# Patient Record
Sex: Male | Born: 1940 | Race: White | Hispanic: No | Marital: Married | Smoking: Former smoker
Health system: Southern US, Community
[De-identification: ages and names within clinical notes are randomized; demographics above are authoritative.]

## PROBLEM LIST (undated history)

## (undated) DIAGNOSIS — K219 Gastro-esophageal reflux disease without esophagitis: Secondary | ICD-10-CM

## (undated) DIAGNOSIS — I10 Essential (primary) hypertension: Secondary | ICD-10-CM

## (undated) DIAGNOSIS — F419 Anxiety disorder, unspecified: Secondary | ICD-10-CM

## (undated) DIAGNOSIS — I2510 Atherosclerotic heart disease of native coronary artery without angina pectoris: Secondary | ICD-10-CM

## (undated) DIAGNOSIS — M79661 Pain in right lower leg: Secondary | ICD-10-CM

## (undated) DIAGNOSIS — M79662 Pain in left lower leg: Secondary | ICD-10-CM

## (undated) DIAGNOSIS — R0602 Shortness of breath: Secondary | ICD-10-CM

## (undated) DIAGNOSIS — E785 Hyperlipidemia, unspecified: Secondary | ICD-10-CM

## (undated) DIAGNOSIS — M1990 Unspecified osteoarthritis, unspecified site: Secondary | ICD-10-CM

## (undated) HISTORY — DX: Shortness of breath: R06.02

## (undated) HISTORY — DX: Hyperlipidemia, unspecified: E78.5

## (undated) HISTORY — DX: Atherosclerotic heart disease of native coronary artery without angina pectoris: I25.10

## (undated) HISTORY — DX: Essential (primary) hypertension: I10

## (undated) HISTORY — DX: Pain in left lower leg: M79.662

## (undated) HISTORY — DX: Pain in right lower leg: M79.661

## (undated) HISTORY — DX: Gastro-esophageal reflux disease without esophagitis: K21.9

## (undated) HISTORY — DX: Anxiety disorder, unspecified: F41.9

## (undated) HISTORY — DX: Unspecified osteoarthritis, unspecified site: M19.90

## (undated) HISTORY — PX: CORONARY ARTERY BYPASS GRAFT: SHX141

---

## 2000-05-27 ENCOUNTER — Encounter: Payer: Self-pay | Admitting: *Deleted

## 2000-05-27 ENCOUNTER — Inpatient Hospital Stay (HOSPITAL_COMMUNITY): Admission: EM | Admit: 2000-05-27 | Discharge: 2000-06-04 | Payer: Self-pay

## 2000-05-29 ENCOUNTER — Encounter: Payer: Self-pay | Admitting: Thoracic Surgery (Cardiothoracic Vascular Surgery)

## 2000-05-31 ENCOUNTER — Encounter: Payer: Self-pay | Admitting: Thoracic Surgery (Cardiothoracic Vascular Surgery)

## 2000-06-01 ENCOUNTER — Encounter: Payer: Self-pay | Admitting: Thoracic Surgery (Cardiothoracic Vascular Surgery)

## 2000-06-02 ENCOUNTER — Encounter: Payer: Self-pay | Admitting: Thoracic Surgery (Cardiothoracic Vascular Surgery)

## 2000-06-03 ENCOUNTER — Encounter: Payer: Self-pay | Admitting: Cardiothoracic Surgery

## 2000-07-03 ENCOUNTER — Encounter
Admission: RE | Admit: 2000-07-03 | Discharge: 2000-07-03 | Payer: Self-pay | Admitting: Thoracic Surgery (Cardiothoracic Vascular Surgery)

## 2000-07-03 ENCOUNTER — Encounter: Payer: Self-pay | Admitting: Thoracic Surgery (Cardiothoracic Vascular Surgery)

## 2000-12-31 ENCOUNTER — Encounter: Payer: Self-pay | Admitting: Neurosurgery

## 2000-12-31 ENCOUNTER — Encounter: Admission: RE | Admit: 2000-12-31 | Discharge: 2000-12-31 | Payer: Self-pay | Admitting: Neurosurgery

## 2001-01-14 ENCOUNTER — Encounter: Payer: Self-pay | Admitting: Neurosurgery

## 2001-01-14 ENCOUNTER — Encounter: Admission: RE | Admit: 2001-01-14 | Discharge: 2001-01-14 | Payer: Self-pay | Admitting: Neurosurgery

## 2001-01-28 ENCOUNTER — Encounter: Admission: RE | Admit: 2001-01-28 | Discharge: 2001-01-28 | Payer: Self-pay | Admitting: Neurosurgery

## 2001-01-28 ENCOUNTER — Encounter: Payer: Self-pay | Admitting: Neurosurgery

## 2001-02-23 ENCOUNTER — Emergency Department (HOSPITAL_COMMUNITY): Admission: EM | Admit: 2001-02-23 | Discharge: 2001-02-24 | Payer: Self-pay | Admitting: Emergency Medicine

## 2001-02-23 ENCOUNTER — Encounter: Payer: Self-pay | Admitting: Emergency Medicine

## 2001-02-25 ENCOUNTER — Encounter: Admission: RE | Admit: 2001-02-25 | Discharge: 2001-02-25 | Payer: Self-pay | Admitting: Neurosurgery

## 2001-02-25 ENCOUNTER — Encounter: Payer: Self-pay | Admitting: Neurosurgery

## 2001-03-04 ENCOUNTER — Encounter: Payer: Self-pay | Admitting: Neurosurgery

## 2001-03-06 ENCOUNTER — Encounter: Payer: Self-pay | Admitting: Neurosurgery

## 2001-03-06 ENCOUNTER — Inpatient Hospital Stay (HOSPITAL_COMMUNITY): Admission: RE | Admit: 2001-03-06 | Discharge: 2001-03-07 | Payer: Self-pay | Admitting: Neurosurgery

## 2001-03-18 ENCOUNTER — Inpatient Hospital Stay (HOSPITAL_COMMUNITY): Admission: EM | Admit: 2001-03-18 | Discharge: 2001-03-21 | Payer: Self-pay | Admitting: Emergency Medicine

## 2001-03-18 ENCOUNTER — Encounter: Payer: Self-pay | Admitting: Emergency Medicine

## 2001-04-04 ENCOUNTER — Inpatient Hospital Stay (HOSPITAL_COMMUNITY): Admission: EM | Admit: 2001-04-04 | Discharge: 2001-04-08 | Payer: Self-pay | Admitting: Emergency Medicine

## 2001-04-04 ENCOUNTER — Encounter: Payer: Self-pay | Admitting: Emergency Medicine

## 2001-05-09 ENCOUNTER — Encounter: Payer: Self-pay | Admitting: Cardiovascular Disease

## 2001-05-09 ENCOUNTER — Ambulatory Visit (HOSPITAL_COMMUNITY): Admission: RE | Admit: 2001-05-09 | Discharge: 2001-05-09 | Payer: Self-pay | Admitting: Cardiovascular Disease

## 2001-06-20 ENCOUNTER — Encounter (INDEPENDENT_AMBULATORY_CARE_PROVIDER_SITE_OTHER): Payer: Self-pay | Admitting: *Deleted

## 2001-06-20 ENCOUNTER — Observation Stay (HOSPITAL_COMMUNITY): Admission: RE | Admit: 2001-06-20 | Discharge: 2001-06-21 | Payer: Self-pay | Admitting: General Surgery

## 2001-07-16 ENCOUNTER — Encounter: Payer: Self-pay | Admitting: Emergency Medicine

## 2001-07-16 ENCOUNTER — Emergency Department (HOSPITAL_COMMUNITY): Admission: EM | Admit: 2001-07-16 | Discharge: 2001-07-17 | Payer: Self-pay | Admitting: Emergency Medicine

## 2002-06-15 ENCOUNTER — Encounter: Payer: Self-pay | Admitting: Emergency Medicine

## 2002-06-15 ENCOUNTER — Inpatient Hospital Stay (HOSPITAL_COMMUNITY): Admission: EM | Admit: 2002-06-15 | Discharge: 2002-06-17 | Payer: Self-pay | Admitting: Emergency Medicine

## 2004-05-24 ENCOUNTER — Observation Stay (HOSPITAL_COMMUNITY): Admission: EM | Admit: 2004-05-24 | Discharge: 2004-05-24 | Payer: Self-pay | Admitting: Emergency Medicine

## 2004-10-25 ENCOUNTER — Ambulatory Visit: Payer: Self-pay | Admitting: Specialist

## 2005-05-23 ENCOUNTER — Emergency Department (HOSPITAL_COMMUNITY): Admission: EM | Admit: 2005-05-23 | Discharge: 2005-05-23 | Payer: Self-pay | Admitting: Emergency Medicine

## 2005-12-11 HISTORY — PX: CARDIOVASCULAR STRESS TEST: SHX262

## 2005-12-25 ENCOUNTER — Observation Stay (HOSPITAL_COMMUNITY): Admission: EM | Admit: 2005-12-25 | Discharge: 2005-12-26 | Payer: Self-pay | Admitting: Emergency Medicine

## 2006-07-22 ENCOUNTER — Other Ambulatory Visit: Payer: Self-pay

## 2006-07-22 ENCOUNTER — Inpatient Hospital Stay: Payer: Self-pay | Admitting: Surgery

## 2006-08-16 ENCOUNTER — Ambulatory Visit: Admission: RE | Admit: 2006-08-16 | Discharge: 2006-11-13 | Payer: Self-pay | Admitting: Radiation Oncology

## 2006-10-03 ENCOUNTER — Inpatient Hospital Stay (HOSPITAL_COMMUNITY): Admission: RE | Admit: 2006-10-03 | Discharge: 2006-10-05 | Payer: Self-pay | Admitting: Urology

## 2006-10-03 ENCOUNTER — Encounter (INDEPENDENT_AMBULATORY_CARE_PROVIDER_SITE_OTHER): Payer: Self-pay | Admitting: *Deleted

## 2007-04-17 ENCOUNTER — Ambulatory Visit: Payer: Self-pay | Admitting: Unknown Physician Specialty

## 2007-05-04 ENCOUNTER — Inpatient Hospital Stay (HOSPITAL_COMMUNITY): Admission: EM | Admit: 2007-05-04 | Discharge: 2007-05-06 | Payer: Self-pay | Admitting: Emergency Medicine

## 2007-10-21 ENCOUNTER — Other Ambulatory Visit: Payer: Self-pay

## 2007-10-21 ENCOUNTER — Emergency Department: Payer: Self-pay | Admitting: Internal Medicine

## 2008-06-02 ENCOUNTER — Ambulatory Visit: Payer: Self-pay | Admitting: Neurology

## 2009-05-05 ENCOUNTER — Inpatient Hospital Stay (HOSPITAL_COMMUNITY): Admission: EM | Admit: 2009-05-05 | Discharge: 2009-05-06 | Payer: Self-pay | Admitting: Emergency Medicine

## 2009-05-06 HISTORY — PX: CARDIAC CATHETERIZATION: SHX172

## 2010-05-16 ENCOUNTER — Ambulatory Visit: Payer: Self-pay | Admitting: Cardiovascular Disease

## 2010-10-01 ENCOUNTER — Ambulatory Visit: Payer: PRIVATE HEALTH INSURANCE | Admitting: Family Medicine

## 2010-11-16 ENCOUNTER — Ambulatory Visit (INDEPENDENT_AMBULATORY_CARE_PROVIDER_SITE_OTHER): Payer: Medicare Other | Admitting: Cardiovascular Disease

## 2010-11-16 DIAGNOSIS — E7800 Pure hypercholesterolemia, unspecified: Secondary | ICD-10-CM

## 2010-11-16 DIAGNOSIS — I2510 Atherosclerotic heart disease of native coronary artery without angina pectoris: Secondary | ICD-10-CM

## 2010-11-16 DIAGNOSIS — Z9861 Coronary angioplasty status: Secondary | ICD-10-CM

## 2010-11-16 DIAGNOSIS — M79609 Pain in unspecified limb: Secondary | ICD-10-CM

## 2010-12-01 ENCOUNTER — Emergency Department (HOSPITAL_COMMUNITY)
Admission: EM | Admit: 2010-12-01 | Discharge: 2010-12-01 | Disposition: A | Discharge status: Home / Self Care | Payer: Medicare Other | Attending: Emergency Medicine | Admitting: Emergency Medicine

## 2010-12-01 DIAGNOSIS — I252 Old myocardial infarction: Secondary | ICD-10-CM | POA: Insufficient documentation

## 2010-12-01 DIAGNOSIS — I10 Essential (primary) hypertension: Secondary | ICD-10-CM | POA: Insufficient documentation

## 2010-12-01 DIAGNOSIS — Z79899 Other long term (current) drug therapy: Secondary | ICD-10-CM | POA: Insufficient documentation

## 2010-12-01 DIAGNOSIS — E785 Hyperlipidemia, unspecified: Secondary | ICD-10-CM | POA: Insufficient documentation

## 2010-12-01 DIAGNOSIS — I2510 Atherosclerotic heart disease of native coronary artery without angina pectoris: Secondary | ICD-10-CM | POA: Insufficient documentation

## 2010-12-01 DIAGNOSIS — I959 Hypotension, unspecified: Secondary | ICD-10-CM | POA: Insufficient documentation

## 2010-12-01 LAB — DIFFERENTIAL
Basophils Absolute: 0 10*3/uL (ref 0.0–0.1)
Basophils Relative: 0 % (ref 0–1)
Lymphocytes Relative: 42 % (ref 12–46)
Lymphs Abs: 2.5 10*3/uL (ref 0.7–4.0)
Monocytes Absolute: 0.5 10*3/uL (ref 0.1–1.0)
Monocytes Relative: 8 % (ref 3–12)
Neutro Abs: 2.9 10*3/uL (ref 1.7–7.7)

## 2010-12-01 LAB — CBC
HCT: 44 % (ref 39.0–52.0)
MCH: 31.8 pg (ref 26.0–34.0)
MCV: 87.5 fL (ref 78.0–100.0)
RBC: 5.03 MIL/uL (ref 4.22–5.81)
RDW: 13.6 % (ref 11.5–15.5)
WBC: 5.9 10*3/uL (ref 4.0–10.5)

## 2010-12-01 LAB — BASIC METABOLIC PANEL
BUN: 18 mg/dL (ref 6–23)
Chloride: 111 mEq/L (ref 96–112)
GFR calc Af Amer: >60 mL/min (ref >60)

## 2010-12-23 LAB — DIFFERENTIAL
Basophils Absolute: 0 10*3/uL (ref 0.0–0.1)
Basophils Relative: 0 % (ref 0–1)
Eosinophils Relative: 1 % (ref 0–5)
Monocytes Absolute: 0.5 10*3/uL (ref 0.1–1.0)
Neutro Abs: 4.1 10*3/uL (ref 1.7–7.7)

## 2010-12-23 LAB — CARDIAC PANEL(CRET KIN+CKTOT+MB+TROPI)
CK, MB: 1 ng/mL (ref 0.3–4.0)
CK, MB: 1.1 ng/mL (ref 0.3–4.0)
Relative Index: INVALID (ref 0.0–2.5)
Total CK: 99 U/L (ref 7–232)
Troponin I: 0.03 ng/mL (ref 0.00–0.06)

## 2010-12-23 LAB — CBC
HCT: 45 % (ref 39.0–52.0)
Hemoglobin: 15.1 g/dL (ref 13.0–17.0)
Hemoglobin: 15.5 g/dL (ref 13.0–17.0)
MCHC: 34.7 g/dL (ref 30.0–36.0)
MCV: 92.8 fL (ref 78.0–100.0)
Platelets: 147 10*3/uL — ABNORMAL LOW (ref 150–400)
RBC: 4.67 MIL/uL (ref 4.22–5.81)
RBC: 4.82 MIL/uL (ref 4.22–5.81)
WBC: 6.8 10*3/uL (ref 4.0–10.5)

## 2010-12-23 LAB — HEPARIN LEVEL (UNFRACTIONATED): Heparin Unfractionated: 0.74 IU/mL — ABNORMAL HIGH (ref 0.30–0.70)

## 2010-12-23 LAB — COMPREHENSIVE METABOLIC PANEL
Albumin: 4.1 g/dL (ref 3.5–5.2)
Potassium: 3.8 mEq/L (ref 3.5–5.1)
Total Bilirubin: 0.9 mg/dL (ref 0.3–1.2)

## 2010-12-23 LAB — TROPONIN I: Troponin I: 0.02 ng/mL (ref 0.00–0.06)

## 2011-01-01 ENCOUNTER — Other Ambulatory Visit: Payer: Self-pay | Admitting: Cardiovascular Disease

## 2011-01-01 DIAGNOSIS — I10 Essential (primary) hypertension: Secondary | ICD-10-CM

## 2011-01-01 NOTE — Telephone Encounter (Signed)
Fax received from pharmacy. Refill completed. Jodette Shelene Krage RN  

## 2011-01-30 NOTE — Discharge Summary (Signed)
[name], [name]             ACCOUNT NO.:  [id number]   MEDICAL RECORD NO.:  [id number]          PATIENT TYPE:  INP   LOCATION:  2033                         FACILITY:  [facility]   PHYSICIAN:  Vesta Mixer, M.D. DATE OF BIRTH:  [birth year]   DATE OF ADMISSION:  05/04/2007  DATE OF DISCHARGE:  05/06/2007                               DISCHARGE SUMMARY   DISCHARGE DIAGNOSIS:  1. Chest pain - noncardiac.  2. History of coronary artery disease status post coronary bypass      grafting in 2001.  3. Hypertension.  4. Hyperlipidemia.  5. History of cholelithiasis.  6. Lumbar disk disease.   DISCHARGE MEDICATIONS:  1. Atenolol 5 mg p.o. b.i.d.  2. Lisinopril 10 mg once a day.  3. Zetia 10 mg a day.  4. HCTZ 25 mg.  5. Potassium chloride 20 mEq a day.  6. Nexium 40 mg once a day.  7. Multivitamin once a day.  8. Aspirin 325 mg once a day.  9. Nitroglycerin 0.4 mg  sublingually as needed for chest pain.   DISPOSITION:  The patient will see Dr. Elease Hashimoto at his previously  scheduled appointment in early September.   HISTORY OF PRESENT ILLNESS:  [name] is a middle-aged gentleman  with a history of coronary artery disease.  He was admitted to the  hospital with episodes of chest pain.  Please see dictated H&P for  further details.   HOSPITAL COURSE:  #1 - CHEST PAIN.  The patient ruled out for myocardial  infarction with serial CPKs.  He did not have any EKG changes.  He was  on heparin until yesterday.  Yesterday, we discontinued heparin and had  him ambulate in the halls.  He did not have any chest pains following  this and did quite well.  He is now discharged in satisfactory  condition.  All of his other medical problems remain fairly stable.           ______________________________  Vesta Mixer, M.D.     PJN/MEDQ  D:  05/06/2007  T:  05/06/2007  Job:  [id number]

## 2011-01-30 NOTE — Cardiovascular Report (Signed)
[name], MANGRUM NO.:  [id number]   MEDICAL RECORD NO.:  [id number]          PATIENT TYPE:  INP   LOCATION:  2504                         FACILITY:  [facility]   PHYSICIAN:  Vesta Mixer, M.D. DATE OF BIRTH:  [birth year]   DATE OF PROCEDURE:  05/06/2009  DATE OF DISCHARGE:  05/06/2009                            CARDIAC CATHETERIZATION   Damante Spragg is a 70 year old gentleman with a history of coronary  artery disease.  He is status post coronary artery bypass grafting.  He  is also status post PTCA and stenting of his right coronary artery.  He  is admitted yesterday with episodes of chest discomfort.  He ruled out  for myocardial infarction.  He is referred today for heart  catheterization based on his symptoms.   The right femoral artery was easily cannulated using modified Seldinger  technique.   HEMODYNAMICS:  LV pressure is 86/12 with an aortic pressure of 84/40.   The blood pressure when the patient was leaving the lab was 98/63.   ANGIOGRAPHY:  Left main.  The left main has minor luminal  irregularities.   Left anterior descending artery is moderate to small in size.  There are  30-40% stenosis diffusely.  The first diagonal artery is occluded and  fills later via the graft.  There is some competitive flow from the  small IMA down into the distal aspect of the LAD.  There are no discrete  stenoses in this area.   Left circumflex.  The left circumflex artery is a moderate-sized vessel.  The obtuse marginal artery is occluded.  The terminal circumflex artery  is normal.   The right coronary artery has diffuse irregularities throughout.  There  is a stent in the proximal aspect of the RCA, which has minor luminal  irregularities.  There is a slight aneurysmal segment in the right  coronary artery and then a 30% narrowing right in the midpoint.  This  30% lesion does not appear to obstruct flow.  There are minor luminal  irregularities in the  remaining mid and distal RCA.  The posterior  descending artery has minor luminal irregularities, but no discrete  stenosis.  The posterolateral branches are moderate in size and have no  irregularities.   The right internal mammary artery is seen to be quite atretic and  supplies no flow to the right coronary artery.  The left internal  mammary artery is quite small and seems to be getting more atretic over  time.  It supplies a very small amount of flow to the distal LAD.  There  are no discrete stenosis, but just a relatively small caliber of this  vessel.   The saphenous vein graft to the diagonal artery is a fairly large graft.  There are mild irregularities.  The native vessel is small, but the  anastomosis is normal.   The saphenous vein graft to the obtuse marginal artery is nice size  graft.  There are minor luminal irregularities.  The anastomosis is  normal.  The native vessel is quite small.   The left ventriculogram is performed in a  30-degree RAO position.  It  reveals mild-to-moderate left ventricular dysfunction.  Ejection  fraction is 40-45%.  There is mid anterior wall hypokinesis.   COMPLICATIONS:  None.   CONCLUSIONS:  Severe native coronary artery disease.  The patient has  fairly good flow down his left anterior descending and down his right  coronary artery.  As a result, his right internal mammary artery is  atretic and the left internal mammary artery is becoming quite small.  He does not have any restrictions to flow down into these vascular beds.   The saphenous vein graft to the diagonal artery and the saphenous vein  graft to the obtuse marginal artery are widely patent.  I suspect that  his pain was noncardiac in origin.  We will continue with home  medications.  We will send him home later today.      Vesta Mixer, M.D.  Electronically Signed     PJN/MEDQ  D:  05/06/2009  T:  05/06/2009  Job:  [id number]

## 2011-01-30 NOTE — Discharge Summary (Signed)
NAMEALEXANDERJAMES, [name]             ACCOUNT NO.:  [id number]   MEDICAL RECORD NO.:  [id number]          PATIENT TYPE:  INP   LOCATION:  2504                         FACILITY:  [facility]   PHYSICIAN:  Vesta Mixer, M.D. DATE OF BIRTH:  1940-09-29   DATE OF ADMISSION:  05/05/2009  DATE OF DISCHARGE:  05/06/2009                               DISCHARGE SUMMARY   DISCHARGE DIAGNOSES:  1. Noncardiac chest pain.  2. History of coronary artery disease - status post coronary artery      bypass grafting.  3. Dyslipidemia.  4. Mild congestive heart failure.  5. Gastroesophageal pain.   DISCHARGE MEDICATIONS:  1. Hydrochlorothiazide 25 mg a day.  2. Alprazolam as needed.  3. Multivitamin once a day.  4. Detrol LA 4 mg a day.  5. Aspirin 325 mg a day.  6. Potassium chloride 20 mEq a day.  7. Pravachol 40 mg twice a day.  8. Lisinopril 10 mg a day.  9. Atenolol 25 mg twice a day.   DISPOSITION:  The patient will see Dr. Elease Hashimoto in the office in 1 to 2  weeks.  He is to call sooner for any questions or problems.   HISTORY:  [name] is a 70 year old gentleman who is admitted  through the emergency room on August 19, for episodes of chest pain.  Please see dictated H and P for further details.   HOSPITAL COURSE:  1. Chest pain.  The patient ruled out for myocardial infarction.  The      following day he went for heart catheterization.  At cath, his left      anterior descending artery was found to have moderate      irregularities.  There were no discrete stenosis.  The left      internal mammary artery did provide some competitive flow down the      LAD, but the LAD flow mainly was from the native LAD.  2. The right coronary artery had moderate irregularities.  The right      coronary artery has been angioplastied and stented in the past.      The right internal mammary artery is now atretic and does not      supply the flow down to the distal right coronary artery.  The left  circumflex artery is a fairly large vessel.  The obtuse marginal      artery #1 is closed.  This branch is fed via a very nice saphenous      vein graft to the first OM.  This graft is normal.  The distal      circumflex artery is normal.  There was also a saphenous vein graft      to the diagonal artery.  The patient has mild left ventricular      dysfunction with an ejection fraction of 40% to 45%.   The patient did fairly well after cath.  He did have some hypotension,  which resolved after some bedrest and 500 mL of normal saline.  He will  be discharged in satisfactory condition.  We will follow up closely with  his blood pressure.  He is to call us if he has any episodes of chest  pain or dizziness.  All of his other medical problems are stable.      Vesta Mixer, M.D.  Electronically Signed     PJN/MEDQ  D:  05/06/2009  T:  05/07/2009  Job:  [id number]

## 2011-01-30 NOTE — H&P (Signed)
NAMEASHTIN, ROSNER NO.:  [id number]   MEDICAL RECORD NO.:  [id number]          PATIENT TYPE:  INP   LOCATION:  2504                         FACILITY:  [facility]   PHYSICIAN:  Vesta Mixer, M.D. DATE OF BIRTH:  Feb 28, 1941   DATE OF ADMISSION:  05/05/2009  DATE OF DISCHARGE:                              HISTORY & PHYSICAL   [name] is an elderly gentleman with a history of coronary  artery disease.  He is status post coronary artery bypass grafting.  He  is admitted now with episodes of chest pain.   [name] has a history of coronary artery disease and status post  stenting as well as coronary artery bypass grafting.  He has overall  done fairly well.  He was just seen in the office in late May and has  been doing fairly well.  He has been trying to get exercise some.   The patient started having some intermittent chest pain yesterday.  He  took a nitroglycerin with relief of the chest pain.  The pain came back  some time later and he took another nitroglycerin.  The pain stayed away  at that point.   The patient had more episodes of chest pain this morning and proceeded  to the emergency room.   The patient has some associated shortness breath.  There is no  diaphoresis.  He states that there is some radiation out to both arms.  He thinks that this feels very similar to previous episodes of pain when  before he had a stent placed.  He denies any syncope or presyncope.  He  denies any PND or orthopnea.  He denies any blood in his urine or blood  in stool.   His current medications are:  1. Atenolol 25 mg twice a day.  2. Lisinopril 10 mg a day.  3. Pravastatin 40 mg twice a day.  4. Potassium chloride 20 mEq a day.  5. Aspirin 325 mg a day.  6. Detrol 4 mg a day.  7. Alprazolam 0.25 mg as needed.   He is allergic to OXYBUTYNIN and also is intolerant to ZOCOR.   PAST MEDICAL HISTORY:  1. Coronary artery disease.  He is status post  coronary artery bypass      grafting.  He has a right internal mammary artery that is known to      be atretic.  He has a saphenous vein graft to the diagonal artery.      He has a saphenous vein graft to the obtuse marginal artery and he      has left internal mammary artery to the LAD.  There is competitive      flow from the native LAD.  2. Dyslipidemia.  3. Hypertension.  4. Benign prostatic hypertrophy.   SOCIAL HISTORY:  The patient is a nonsmoker.  He used to smoke  cigarettes.  He does not drink alcohol.   His family history is positive for cardiac disease.   His review of systems was reviewed.  As noted in the HPI.  All other  systems were reviewed and  are negative.   PHYSICAL EXAMINATION:  GENERAL:  He is a middle-aged gentleman in no  acute distress.  He is alert and oriented x3 and his mood and affect are  normal.  VITAL SIGNS:  His blood pressure is 138/78 with a heart rate of 61.  HEENT:  2+ carotids.  He has no bruits, no JVD, no thyromegaly.  His  sclerae are nonicteric.  His mucous membranes are moist.  NECK:  Supple.  LUNGS:  Clear.  HEART:  Regular rate, S1 and S2.  He has no murmurs, gallops, or rubs.  His PMI is nondisplaced.  ABDOMEN:  Good bowel sounds.  There is no hepatosplenomegaly.  There is  no guarding or rebound.  His abdomen is nontender.  EXTREMITIES:  He has no clubbing, cyanosis, or edema.  His pulses are  2+.  There is no rash.  There are no palpable cords.  NEURO:  Nonfocal.  Cranial nerves II-XII are intact.  His left pupil is  larger than his right pupil.   His EKG reveals normal sinus rhythm.  He has no ST-T wave changes.   LABORATORY DATA:  Cardiac enzymes are negative.  His initial chemistry  levels are unremarkable.  His sodium is 141, potassium is 3.8, chlorides  103, CO2 is 29, glucose is 95, BUN is 18, and creatinine is 1.26.  His  white blood cell count 6.8, hemoglobin 15.5, and hematocrit 45%.  His  cardiac enzymes are negative.   His chest x-ray is unremarkable.   [name] presents with some episodes of chest pain.  These are  suspicious for coronary artery disease.  We will admit him and place him  on IV nitro and IV heparin.  We will continue with his other  medications.  We have scheduled him for heart catheterization tomorrow.  We have discussed the risks, benefits, and options of heart  catheterization.  He understands and agrees to proceed.      Vesta Mixer, M.D.  Electronically Signed     PJN/MEDQ  D:  05/05/2009  T:  05/06/2009  Job:  [id number]

## 2011-01-30 NOTE — H&P (Signed)
[name], [name]             ACCOUNT NO.:  [id number]   MEDICAL RECORD NO.:  [id number]          PATIENT TYPE:  INP   LOCATION:  1826                         FACILITY:  [facility]   PHYSICIAN:  Georga Hacking, M.D.DATE OF BIRTH:  26-Apr-1941   DATE OF ADMISSION:  05/04/2007  DATE OF DISCHARGE:                              HISTORY & PHYSICAL   REASON FOR ADMISSION:  Chest and back pain.   HISTORY:  The patient is a 70 year old male who is admitted for back and  chest pain.  He has a previous history of coronary artery bypass  grafting in 2001 with an internal mammary graft to the LAD, a right  internal mammary graft to the right coronary artery, a saphenous vein  graft to the diagonal, a saphenous vein graft to the obtuse marginal-2.  He developed recurrent pain following bypass and had stenting of the  right coronary artery done for a severe proximal stenosis.  He has since  had repeat catheterization in 2003, 2005, and the last in 2007.  At the  time, he had an atretic mammary artery and a widely patent internal  mammary artery and normal saphenous vein grafts in April 2007.   He awoke with mid upper back pain in the left upper region, described as  sharp as if a knife was sticking in him.  It then radiated to the  anterior part of his chest and became pressure like.  He took  nitroglycerin with partial relief but did not completely go away and was  brought here.  He is not currently having chest discomfort at the  present time.  Initial cardiac enzymes and EKG were negative.  He has no  history of heart failure or arrhythmia.  He has not had recent angina.   PAST HISTORY:  1. Hypertension.  2. He has hyperlipidemia but is uncertain whether he is Statin      intolerant or not.  3. He has a previous history of cholelithiasis.  4. He is on Nexium for some sort of stomach trouble.  5. Lumbar disk disease.   PREVIOUS SURGERY:  1. Coronary artery bypass grafting.  2. Lumbar  laminectomy.  3. Shoulder surgery.  4. He had laparoscopic prostatectomy for prostate cancer last year.  5. In addition, he has had a laparoscopic cholecystectomy.   ALLERGIES:  1. WELLBUTRIN.  2. POSSIBLE INTOLERANCE TO STATIN THERAPY BUT HE IS UNCERTAIN ABOUT      THIS.   CURRENT MEDICATIONS:  1. Lisinopril 10 mg daily, which represents a recent change from      Altace.  2. HCTZ 25 mg daily.  3. Potassium 20 mEq daily.  4. Zetia 10 mg daily.  5. Atenolol 25 mg b.i.d.  6. Nexium 40 mg.  7. Multivitamin daily.  8. Aspirin 325 mg daily.   SOCIAL HISTORY:  He is retired from [employer].  He lives with his wife  of many years in [place].  He was a heavy smoker but quit in 2001, at  the time of his bypass graft and does not drink alcohol to excess.   FAMILY HISTORY:  He has several siblings who have had coronary artery  disease in their early 71s.   REVIEW OF SYSTEMS:  He has had no abnormal weight loss.  He has a  history of multiple skin cancers removed.  He has no history of  cataracts or glaucoma.  He has had some mild recent nose bleeds.  He  denies difficulty hearing or difficulty swallowing.  He also has a  history of reflux as well as gastritis.  He has a history of prostate  cancer but reports his PSA at zero.  He has occasional incontinence and  erectile dysfunction since then.  He has significant low back pain with  lumbar disk disease and in addition, has had some mild distal arthritis.  He has no history of stroke or TIA.  No history of allergies.   PHYSICAL EXAMINATION:  GENERAL:  He is a pleasant elderly male in no  acute distress.  VITAL SIGNS:  Blood pressure was 100/60, pulse was 65 and regular.  SKIN:  Warm and dry.  He has a balding male hair pattern.  ENT:  EOMI.  PERRLA.  CNS clear.  Fundi not examined in the emergency  room.  Pharynx negative.  NECK:  Supple without masses, JVD, thyromegaly, or bruits.  LUNGS:  Barrel chest, increased AP diameter.   Breath sounds were normal.  CARDIAC:  Shows a normal S1 and S2.  No S3, S4, or murmur.  ABDOMEN:  Soft and nontender.  Femoral pulses 2 plus.  Pedal pulses are  diminished on the right and are 2 plus on the left.    EKG shows an IV conduction delay but was otherwise unremarkable.   LABORATORY:  Unremarkable except for a glucose of 160.   IMPRESSION:  1. Chest and back pain rule out aortic dissection or unstable angina.      By his description this is a different pain than his previous      ischemic pain.  2. Coronary artery disease.  His previous bypass grafting in 2001,      last catheterization in 2005, showed patent graft to the obtuse      marginal-2, the diagonal-2, and the internal mammary graft patent      to the left anterior descending artery.  The stent site to the      right coronary artery was widely patent.  He had a small diagonal      branch with a stenosis in it.  3. Hypertensive heart disease.  4. Hyperlipidemia, uncertain if can take Statin therapy.  5. History of reflux.  6. Prostate cancer, treated with prostatectomy.  7. Low back pain.  8. Elevated glucose without previous diagnosis of diabetes.   RECOMMENDATIONS:  1. Obtain CT scan of chest.  2. Begin heparin.  3. Admit to rule out an MI.  4. Further workup per Dr. Elease Hashimoto.      Georga Hacking, M.D.  Electronically Signed     WST/MEDQ  D:  05/04/2007  T:  05/04/2007  Job:  [id number]   cc:   Vesta Mixer, M.D.  [place]

## 2011-02-02 NOTE — H&P (Signed)
[name], [name]             ACCOUNT NO.:  [id number]   MEDICAL RECORD NO.:  [id number]          PATIENT TYPE:  INP   LOCATION:  0001                         [facility]ACILITY:  [facility]   PHYSICIAN:  Heloise Purpura, MD      DATE OF BIRTH:  04-02-1941   DATE OF ADMISSION:  10/03/2006  DATE OF DISCHARGE:                              HISTORY & PHYSICAL   CHIEF COMPLAINT:  Prostate cancer.   HISTORY:  Mr. Lazarus is a 70 year old gentleman with clinical stage  T2A prostate cancer with a PSA of 4.8 and a Gleason score of 3+3=6.  His  biopsy was performed on June 18, 2006 and demonstrated 2% of the left  lateral mid and 4% of the left base specimens to be involved with  adenocarcinoma.  Prostate volumes were measured at 84 cc.  After  discussion regarding management options for clinically localized  prostate cancer, the patient elected to proceed with surgical therapy.   PAST MEDICAL HISTORY:  1. Coronary artery disease, status post myocardial infarction, status      post coronary artery bypass graft in 2001.  2. Hypertension.  3. Gastroesophageal reflux disease.  4. Dyslipidemia.   PAST SURGICAL HISTORY:  1. Back surgery in 1985, 1994, and 2002.  2. Coronary artery bypass graft in 2001.  3. Shoulder surgery in 1986 and 2005.  4. Cholecystectomy in 2004.  5. Laparoscopic appendectomy on July 22, 2006.   MEDICATIONS:  1. Atenolol.  2. Altace.  3. Isosorbide.  4. Potassium.  5. Nexium.  6. Zetia.  7. Aspirin.  8. Nitroglycerin p.r.n.   ALLERGIES:  None.   FAMILY HISTORY:  Patient's mother died at age 98 from stomach cancer.  His father died at age 42 from blood in the lungs.  The patient denies  any family history of prostate cancer.  He has a brother at age 57 who  has bladder cancer.   REVIEW OF SYSTEMS:  Positive for a history of shortness of breath, chest  pain, and lower extremity edema.  All other systems are reviewed and are  negative.   PHYSICAL EXAMINATION:   CONSTITUTIONAL:  Alert and oriented in no acute  distress.  CARDIOVASCULAR:  Regular rate and rhythm without obvious murmurs.  LUNGS:  Clear bilaterally.  ABDOMEN:  Soft and nontender.  DRE:  The patient does have a small, soft nodule of the left mid portion  of the prostate, consistent with a T2A lesion.   IMPRESSION:  Low risk clinically with otherwise prostate cancer.   PLAN:  Mr. Sangha has been thoroughly counseled regarding options for  management of prostate cancer.  We have specifically discussed active  surveillance, considering his history of coronary artery disease;  however, the patient did wish to proceed with surgical removal of his  prostate.  He underwent a thorough cardiac evaluation by Dr. Elease Hashimoto and  was felt to be at low risk for undergoing surgery.  The plan therefore  is for the patient to undergo robotic-assisted laparoscopic radical  prostatectomy and then be admitted to the hospital for routine  postoperative care.  ______________________________  Heloise Purpura, MD  Electronically Signed     LB/MEDQ  D:  10/03/2006  T:  10/03/2006  Job:  [id number]

## 2011-02-02 NOTE — Cardiovascular Report (Signed)
NAME:  [name], [name]                       ACCOUNT NO.:  [id number]   MEDICAL RECORD NO.:  [id number]                   PATIENT TYPE:  INP   LOCATION:  3731                                 FACILITY:  [facility]   PHYSICIAN:  Vesta Mixer, M.D.              DATE OF BIRTH:  09-Jul-1941   DATE OF PROCEDURE:  06/16/2002  DATE OF DISCHARGE:                              CARDIAC CATHETERIZATION   INDICATION:  The patient is a 70 year old gentleman with a history of  coronary artery disease -- status post coronary artery bypass grafting; he  is also status post PTCA and stenting of his proximal right coronary artery  after being found to have an atretic and nonfunctional right internal  mammary artery.  He has done fairly well but presented to the hospital  yesterday with episodes of chest pain and shortness of breath.  The chest  pains were somewhat atypical.  He ruled out for myocardial infarction and  was referred for a heart catheterization today.   PROCEDURE:  Heart catheterization with coronary angiography and left  ventriculography.   DESCRIPTION OF PROCEDURE:  The right femoral artery was easily cannulated  using a modified Seldinger technique.   HEMODYNAMIC DATA:  The left ventricular pressure is 90/16 with an aortic  pressure of 90/58.   ANGIOGRAPHY:  The left main coronary artery is relatively smooth and normal.   The left anterior descending artery has moderate severe disease between 75%  and 80% throughout the course of the LAD.  There is obvious competitive flow  from the left internal mammary artery.   The left circumflex artery is a relatively small vessel.  There are diffuse  irregularities between 50% and 60% throughout the course of the circumflex  artery.   The right coronary artery is large and is dominant.  The proximal stent is  widely patent.  There are minor luminal irregularities throughout the course  of the RCA.  The posterior descending artery and the  posterolateral segment  artery are normal.   The saphenous vein graft to the second obtuse marginal artery is widely  patent.  The anastomosis is normal.  The native vessel is fairly small but  is otherwise unremarkable.   The saphenous vein graft to the diagonal system is smooth and normal.  The  anastomosis is normal and the native vessel is normal.   The right internal mammary artery is atretic with no significant flow down  to the right coronary artery.  This would be expected, given the high flow  down the native right coronary artery.   The left internal mammary artery is widely patent.  There is brisk flow down  the LAD.  There are some mild-to-moderate irregularities in the distal  aspect of the LAD between 20% and 30%.  These do not appear to be flow-  obstructive.  There is obvious competitive flow from the native system.   Left ventriculogram  was performed in a 30 RAO position.  It revealed overall  normal left ventricular systolic function with no mitral regurgitation.   COMPLICATIONS:  None.   CONCLUSIONS:  1. Severe native coronary artery disease.  2. Patent left internal mammary artery and saphenous vein graft to the     obtuse marginal system and diagonal system.  His native right coronary     artery is widely patent after being stented last year.  The right     internal mammary artery is atretic and is nonfunctional.  We will     continue with medical therapy.                                               Vesta Mixer, M.D.    PJN/MEDQ  D:  06/16/2002  T:  06/20/2002  Job:  [id number]   cc:   Yates Decamp

## 2011-02-02 NOTE — Discharge Summary (Signed)
[place]. [facility]  Patient:    [name], [name]                    MRN: [id number] Adm. Date:  [id number] Disc. Date: [id number] Attending:  Jackelyn Knife                           Discharge Summary  HISTORY OF PRESENT ILLNESS:  [name] is a 70 year old male who presented with left leg pain and low back pain.  Subsequent work-up included a lumbosacral MRI as well as myelography and CT scanning.  These studies demonstrated evidence of foraminal constriction and nerve root compression on the left at L3-L4 and L4-L5.  He had been previously operated upon at L4-L5.  HOSPITAL COURSE:  After an appropriate preoperative work-up included an explanation of the goals and risks of the procedure, he was taken to the operating room on June 20,2002.  He underwent a left L3-L4 hemilaminectomy and diskectomy and re-exploration on the left at L4-L5 with removal of scar tissue and recurrent ruptured disc material.  Foraminotomies were done at each level to relieve compression of the exiting nerve roots.  He did well and tolerated the surgery with no particular problems.  He was able to go home the day following surgery with his incision clean and healing.  DISCHARGE DIAGNOSES: 1. 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 2. Epidural fibrosis at left L4-L5. 3. 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  CONDITION ON DISCHARGE:  Improving.  ACTIVITY:  Up ad lib with a 10 pound weight lifting restriction for four weeks.  DIET:  Regular diet.  FOLLOWUP:  Back to see me in the office in 10 days.  DISCHARGE MEDICATION: 1. Percocet. 2. Continue on current heart and blood pressure medications that are    unchanged. DD:  03/07/01 TD:  03/10/01 Job: [id number]

## 2011-02-02 NOTE — H&P (Signed)
NAME:  [name], [name] NO.:  [id number]   MEDICAL RECORD NO.:  [id number]          PATIENT TYPE:  EMS   LOCATION:  MAJO                         FACILITY:  [facility]   PHYSICIAN:  Ulyses Amor, MD DATE OF BIRTH:  04-Sep-1941   DATE OF ADMISSION:  12/24/2005  DATE OF DISCHARGE:                                HISTORY & PHYSICAL   [name] is a 70 year old white male who is admitted to [facility] for further evaluation of chest pain.   The patient has a history of coronary artery disease which dates back to  2001.  At that time, he underwent coronary artery bypass surgery.  In 2002,  he underwent stenting of the right coronary artery.  In 2003, he returned  with chest pain.  Cardiac catheterization was performed.  This revealed the  left main to be normal.  The LAD had moderate severe disease between 75-80%  throughout its course.  There was obvious competitive flow from the left  internal mammary artery.  The circumflex was a relatively small vessel.  There were diffuse irregularities between 50% and 60% throughout its course.  The right coronary artery was large and dominant.  The proximal stent was  widely patent.  There were minor luminal irregularities throughout the  course of the right coronary artery.  The posterior descending artery and  the posterolateral segment were normal.  The saphenous vein graft to the  second obtuse marginal was widely patent.  The anastomosis was normal.  The  native vessel was small but otherwise unremarkable.  The saphenous vein  graft to the diagonal system was normal.  The anastomosis was normal and the  native vessel was normal.  The right internal mammary artery was atretic  with no significant flow to the right coronary artery.  The left internal  mammary artery was widely patent.  There was brisk flow down the LAD.  There  was some mild to moderate irregularities in the distal aspect of the LAD of  approximately 20-30%.  These did not appear to be flow obstructive.  There  was obvious competitive flow from the native system.  Left ventricular  function was normal.  Continued medical therapy was recommended.   He was readmitted in 2005 for chest pain.  Cardiac catheterization was again  performed.  The left main coronary artery was felt to be fairly normal.  The  LAD had moderate diffuse disease.  There was competitive flow down the  distal LAD.  The circumflex had mild to moderate irregularities.  The first  obtuse marginal was occluded.  The right coronary artery had mild luminal  irregularities.  The proximal stent was widely patent.  There was moderate  stenosis in the midlesion.  The tightest stenosis was felt to be 20-25% in  severity.  The distal right coronary artery had mild irregularities. The  right internal mammary artery was very atretic and did not supply any  significant flow to the coronary bed.  The left internal mammary artery was  widely patent.  It supplied the distal LAD very nicely.  The saphenous  vein  graft to the diagonal was normal.  The saphenous vein graft to the  circumflex was normal.  Continued medical therapy was recommended.   The patient returned to the emergency department this evening with chest  pain.  It began at noon.  The chest pain was described as a pressure in the  upper anterior chest.  It radiated to the left arm.  It was not associated  with dyspnea, diaphoresis or nausea.  The patient has experienced multiple  episodes, lasting from five seconds to five minutes, over the course of the  day.  Two nitroglycerin tablets ameliorated though did not relieve his  symptoms.  He is free of chest pain at this time.  The patient was actually  scheduled to undergo cardiac catheterization in two days because he has  experienced similar such episodes in recent weeks.  The chest pain appeared  to be unrelated to position, activity, meals or  respirations.   There is no history of congestive heart failure or arrhythmia.  The patient  has a history of hypertension and dyslipidemia.  There is no history of  diabetes mellitus.  He stopped smoking at the time of his bypass.  There is  a family history of coronary artery disease.  He has siblings who suffered  from coronary artery disease in the early 70s.  In addition to the  aforementioned problems, the patient has a history of gastroesophageal  reflux.   ALLERGIES:  WELLBUTRIN.   MEDICATIONS:  Altace, Atenolol, hydrochlorothiazide, isosorbide mononitrate,  potassium, and Zetia.   OPERATIONS:  In addition to coronary artery bypass surgery he has undergone  left shoulder and multiple back operations.   SOCIAL HISTORY:  The patient is retired.  He lives with his wife.   REVIEW OF SYSTEMS:  Reveals no new problems related to his head, eyes, ears,  nose, mouth, throat, lungs, gastrointestinal system, genitourinary system or  extremities.  There is no history of neurologic or psychiatric disorder.  There is no history of fever, chills or weight loss.   PHYSICAL EXAMINATION:  Blood pressure 94/64, pulse 66 and regular,  respirations 20, temperature 98.1.  The patient was an older white man in no  discomfort.  He was alert, oriented, appropriate, and responsive.  Head, eyes, nose and mouth was normal.  The neck was without thyromegaly or  adenopathy. Carotid pulses were palpable bilaterally and without bruits.  Cardiac examination revealed a normal S1 and S2.  There was no S3, S4,  murmur, rub, or click.  Cardiac rhythm was regular.  No chest wall  tenderness was noted.  The lungs were clear.  The abdomen was soft and  nontender.  There was no mass, hepatosplenomegaly, bruit, distension,  rebound, guarding or rigidity.  Bowel sounds were normal.  Rectal and  genital examinations were not performed as they were not pertinent to the reason for acute care hospitalization.  The  extremities were without edema,  deviation or deformity.  Radial and dorsalis pedis pulses were palpable  bilaterally.  Brief screening neurologic survey was unremarkable.   The electrocardiogram revealed normal sinus rhythm.  There was a nonspecific  intraventricular conduction delay.  There were no repolarization  abnormalities specific for ischemia or infarction.  The chest radiograph  report was pending at the time of this dictation.  The initial set of  cardiac markers revealed a myoglobin of 97.2, CK-MB less than 1.0 and  troponin less than 0.05.  The second set of cardiac markers revealed  a  myoglobin of 87.2, CK-MB less than 1.0, and troponin less than 0.05.  The  remaining studies were pending at the time of this dictation.   IMPRESSION:  1.  Chest pain:  Rule out unstable angina.  2.  Coronary artery disease:  Status post coronary artery bypass surgery in      2001.  Status post right coronary artery stent in 2002.  Last cardiac      catheterization was performed in 2005 and demonstrated no need for      intervention.  3.  Hypertension.  4.  Dyslipidemia.  5.  Gastroesophageal reflux disease.   PLAN:  1.  Telemetry.  2.  Serial cardiac enzymes.  3.  Aspirin.  4.  Intravenous heparin.  5.  Intravenous nitroglycerin.  6.  Further measures per Dr. Elease Hashimoto.      Ulyses Amor, MD  Electronically Signed     MSC/MEDQ  D:  12/25/2005  T:  12/25/2005  Job:  [id number]   cc:   Vesta Mixer, M.D.  Fax: [phone]

## 2011-02-02 NOTE — Op Note (Signed)
[place]. [facility]  Patient:    [name], [name]                    MRN: [id number] Proc. Date: 05/31/00 Adm. Date:  [id number] Attending:  Charlett Lango CC:         Alvia Grove., M.D.   Operative Report  PREOPERATIVE DIAGNOSIS:  Severe coronary disease with unstable angina.  POSTOPERATIVE DIAGNOSIS:  Severe coronary disease with unstable angina.  OPERATION PERFORMED:  Median sternotomy, coronary artery bypass grafting x 4 (left internal mammary artery to left anterior descending, right internal mammary artery to distal right coronary artery, saphenous vein graft to the second diagonal, saphenous vein graft to obtuse marginal 1).  SURGEON:  Salvatore Decent. Dorris Fetch, M.D.  ASSISTANT: 1. Areta Haber, P.A. 2. [place].  ANESTHESIA:  General.  FINDINGS:  LAD, D2, OM1 all good quality targets but with diffuse clotting not hemodynamically significant.  Distal right coronary artery large caliber with diffuse plaquing.  Good quality conduits.  Preserved left ventricular function.  CLINICAL NOTE:  [name] is a 70 year old gentleman who presented with unstable angina.  He underwent cardiac catheterization which revealed severe three-vessel coronary disease and was referred for coronary artery bypass grafting.  The indications, risks, benefits and alternatives were discussed in detail with the patient, he understood the risks, accepted them and agreed to proceed.  DESCRIPTION OF PROCEDURE:  [name] was brought to the preop holding area on May 31, 2000. Lines were placed to monitor arterial, central venous and pulmonary arterial pressure.  EKG leads were placed for continuous telemetry.  The patient was taken to the operating room, anesthetized and intubated.  A Foley catheter was placed.  Intravenous antibiotics were administered.  The chest, abdomen and legs were prepped and draped in the usual fashion.  A median  sternotomy was performed.  The left internal mammary artery was harvested in a standard fashion.  A subcutaneous incision was made in the medial aspect of the right leg and the greater saphenous vein was harvested from the ankle to the midcalf.  The saphenous vein became small at that area and additional vein was harvested from the left lower extremity.  The patient was given 5000 units prior to dividing the distal end of the mammary artery. There was good flow through the cut end of the vessel.  The mammary was placed in a papaverine soaked sponge and placed into the left pleural space.  Next, the right internal mammary artery was taken down in a similar fashion.  The remainder of the full heparin dose was given before dividing the distal end of the right mammary artery and there was excellent flow through the cut end of the vessel.  The right mammary was placed in a papaverine soaked sponge and placed into the right pleural space.  The pericardium was opened, the ascending aorta was inspected.  It was normal size.  There was no palpable atherosclerotic disease.  The aorta was cannulated via concentric 2-0 Ethibond nonpledgeted pursestring sutures.  The dual stage venous cannula was placed via a pursestring suture in the right atrial appendage.  Cardiopulmonary bypass was instituted.  The patient was cooled to 32 degrees Celsius.  The coronary arteries were inspected and anastomotic sites were chosen.  The conduits were inspected and cut to length. A foam pad was placed in the pericardium to protect the left phrenic nerve and a temperature probe was placed in a myocardial septum.  Cardioplegia cannula was placed in the ascending aorta.  Of note, all coronaries had diffuse plaquing distally but these were not not hemodynamically significant lesions.  The aorta was crossclamped.  The left ventricle was emptied via the aortic root vent.  Cardiac arrest then was achieved with a combination of  cold antegrade blood cardioplegia and topical iced saline.  After achieving a complete diastolic arrest and a myocardial septal temperature of 11 degrees Celsius, the following distal anastomoses were performed.  First a reversed saphenous vein graft was placed end-to-side to the first obtuse marginal branch of the left circumflex coronary artery.  The first obtuse marginal was a 1.5 mm good quality target.  The vein graft was good quality.  The anastomosis was performed with a running 7-0 Prolene suture.  It was probed proximally and distally to ensure patency.  There was excellent flow to the graft.  Next, a reversed saphenous vein graft was placed end-to-side to the second diagonal branch of the LAD.  The first diagonal was too small to graft, the second diagonal was 1.5 mm good quality target.  The anastomosis was performed with a running 7-0 Prolene suture in end-to-side fashion.  Again, the vein graft was of good quality. Again, there was good flow through this graft. Cardioplegia was administered down both the vein grafts and both had good hemostasis.  Next, the right internal mammary artery was brought through a window in the pericardium anterior to the right phrenic nerve.  The distal end was spatulated.  A single fascial releasing incision was made through the muscular fascia to prevent any tension on the anastomosis.  The distal right mammary was  a 1.5 mm good quality vessel.  There was excellent flow through the graft.  It was anastomosed end-to-side to the distal right coronary artery. The distal right coronary was approximately 2.5 mm in diameter.  It did have concentric atherosclerotic plaquing but a 1.5 mm probe passed easily into both distal branches.  At the completion of this anastomosis, the bulldog clamp was briefly removed from the right mammary artery to inspect for hemostasis. I t then was replaced and the mammary pedicle was tacked to the epicardial surface of  the heart with 6-0 Prolene sutures.  Additional cardioplegia was administered.   Next, the left internal mammary artery was brought through a window in the pericardium anterior to the left phrenic nerve.  The distal end was spatulated.  It was anastomosed end-to-side to the distal LAD, which was a 1.5 mm good quality target.  There was some posterior plaquing at the site of the anastomosis.  At the completion of the mammary to LAD anastomosis, the bulldog clamp was removed from the mammary artery.  An immediate and rapid septal rewarming was noted.  Lidocaine was administered.  The mammary pedicle was tacked to the epicardial surface of the heart with 6-0 Prolene sutures. The bulldog clamp was removed from the right mammary artery and the aortic crossclamp was removed.  Total crossclamp time was 48 minutes.  Two defibrillations with 20 joules were required.  The patient then resumed sinus rhythm and remained in sinus rhythm throughout the remainder of the procedure.  A partial occlusion clamp was placed on the ascending aorta.  The cardioplegia cannula was removed.  The proximal vein graft anastomoses were performed to 4.4 mm punch aortotomies with running 6-0 Prolene sutures.  At the completion of the final proximal anastomosis, the patient was placed in Trendelenburg position.  Air was allowed to vent  and the partial clamp was removed.  Air was then aspirated from the vein graft and the bulldog clamp aws removed and flow was restored.  All proximal and distal anastomoses were inspected for hemostasis.  Epicardial pacing wires were placed on the right ventricle and right atrium.  The patient had been rewarmed during the performance of the proximal anastomoses.  When the core temperature reached 37 degrees Celsius, he was weaned from cardiopulmonary bypass.  He was atrially paced for rate and on no inotropic support at time of separation from bypass.  Initial cardiac index was 3L per  minute per meter squared and the patient remained hemodynamically stable throughout the postbypass period.  A test dose of protamine was administered and was well tolerated.  The atrial and aortic cannulae were removed.  The remainder of the protamine was administered without incident.  The chest was irrigated with 1L of warm normal saline containing 1 gm of vancomycin.  Hemostasis was achieved.  The pericardium was reapproximated with interrupted 3-0 silk sutures.  The chest tubes were placed through separate subcostal incisions.  Bilateral pleural tubes and two mediastinal chest tubes were placed.  All were secured with #1 silk sutures.  The sternum was closed with interrupted heavy gauge stainless steel wires.  The pectoralis fascia was closed with running #1 Vicryl suture in all incisions.  The subcutaneous tissues were closed with running 2-0 Vicryl suture and the skin was closed with 3-0 Vicryl subcuticular suture.  At the end of the procedure there was a missing needle from the 8-0 Prolene suture.  The patient was taken from the operating room to the surgical intensive care unit intubated in stable condition. DD:  05/31/00 TD:  06/03/00 Job: [id number]

## 2011-02-02 NOTE — H&P (Signed)
NAME:  [name], [name]                       ACCOUNT NO.:  [id number]   MEDICAL RECORD NO.:  [id number]                   PATIENT TYPE:  EMS   LOCATION:  MAJO                                 FACILITY:  [facility]   PHYSICIAN:  Vesta Mixer, M.D.              DATE OF BIRTH:  1941-05-09   DATE OF ADMISSION:  06/15/2002  DATE OF DISCHARGE:                                HISTORY & PHYSICAL   The patient is a 70 year old gentleman with a history of coronary artery  disease status post coronary artery bypass grafting as well as PTCA and  stenting of his proximal right coronary artery.  He has also had  cholecystectomy.  He had longstanding chest pain and abdominal  pain for the  past several years.  He presents today with recurrent chest pain.   The patient  has been in relatively good health.  He has not had any severe  episodes of chest pain recently.  Last night he developed some chest  tightness and some right-sided chest pain.  It was partially relieved with  sublingual nitroglycerin although it continued to be tight throughout the  evening.  He also had some chest tightness and some shortness of breath.  He  did not have any diaphoresis.  The nitroglycerin helped to some degree.  This morning the chest pain worsened and he presented to our office and was  referred over the emergency room for further evaluation.   CURRENT MEDICATIONS:  1. Nexium 40 mg  in the morning.  2. Carafate 1 g p.o. t.i.d.  3. Atenolol 25 mg twice a day.  4. Altace 5 mg twice a day.  5. Potassium chloride 20 mEq once a day.  6. Imdur 30 mg a day.  7. Enteric coated aspirin 325 mg a day.   He has no known drug allergies.   PAST MEDICAL HISTORY:  1. Coronary artery disease, status post coronary artery bypass grafting.  2. Hypertension.  3. Status post cholecystectomy.   SOCIAL HISTORY:  The patient has been a long term smoker, but quit last  year.  He does not drink alcohol.   FAMILY HISTORY:   Noncontributory.   REVIEW OF SYSTEMS:  Reviewed and is otherwise unremarkable.   PHYSICAL EXAMINATION:  GENERAL:  He is a middle-age gentleman in no acute  distress.  He is alert and oriented x 3 and his mood and affect are normal.  VITALS:  His heart rate is 61, his blood pressure is 125/66 with a  respiratory rate of 20.  HEENT:  Reveals 2+ carotids. Has no bruits. No JVD.  No thyromegaly.  LUNGS:  Clear to auscultation.  HEART:  Regular rate. S1, S2. He has no murmurs, gallops or rubs.  ABDOMEN:  Reveals good bowel sounds.  Nontender.  EXTREMITIES:  He has no clubbing, cyanosis or edema.  NEURO:  Exam is nonfocal.  His pulses are intact.  His calves  are nontender.  His gait was not assessed.  SKIN:  Warm and dry.   His EKG reveals normal sinus rhythm.  He has mild T wave inversions in the  lead AVL which is not too different from his previous tracing.  His cardiac  enzymes and the remainder of his laboratory work are unremarkable.   The patient presents with recurrent episode of chest pain as well as chronic  abdominal  pain.  His enzymes and EKG were negative so far.  It has been  very difficult to figure out the patient's pain.  He definitely has coronary  artery disease, but he also has some degree of noncardiac chest pain.  We  will probably have to repeat a heart catheterization to evaluate his cardiac  status.  He has had significant coronary artery disease in the past with  similar symptoms.  All of his other medical problems are stable.                                               Vesta Mixer, M.D.    PJN/MEDQ  D:  06/15/2002  T:  06/17/2002  Job:  [id number]   cc:   Yates Decamp

## 2011-02-02 NOTE — Op Note (Signed)
[name], [name]             ACCOUNT NO.:  [id number]   MEDICAL RECORD NO.:  [id number]          PATIENT TYPE:  INP   LOCATION:  0001                         FACILITY:  [facility]   PHYSICIAN:  Heloise Purpura, MD      DATE OF BIRTH:  Sep 12, 1941   DATE OF PROCEDURE:  10/03/2006  DATE OF DISCHARGE:                               OPERATIVE REPORT   PREOPERATIVE DIAGNOSIS:  Clinically localized adenocarcinoma of  prostate.   POSTOPERATIVE DIAGNOSIS:  Clinically localized adenocarcinoma of  prostate.   PROCEDURE:  Robotic assisted laparoscopic radical prostatectomy  (bilateral nerve sparing).   SURGEON:  Crecencio Mc, M.D.   ASSISTANT:  Cornelious Bryant, MD.   ANESTHESIA:  General.   ESTIMATED BLOOD LOSS:  300 mL.   INTRAVENOUS FLUIDS:  1500 mL of lactated Ringer's.   COMPLICATIONS:  None.   SPECIMENS:  Prostate and seminal vesicles.   DRAINS:  1. A 20-French straight catheter.  2. A #19 Blake pelvic drain.   INDICATIONS:  Mr. [name] is a 70 year old gentleman with clinical  stage T2a prostate cancer with a PSA of 4.8 and Gleason Score of 3 + 3 =  6.  His pretreatment IIEF score was 16 with an IPSS score of 8.  After  discussing management options for clinically localized prostate cancer,  the patient elected to proceed with the above procedure.  Potential  risks and benefits were discussed with the patient and he consented.   DESCRIPTION OF PROCEDURE:  The patient was taken to the operating room  and a general anesthetic was administered.  The patient was given  preoperative antibiotics, placed in the dorsal lithotomy position and  prepped and draped in the usual sterile fashion.  Next, a preoperative  time-out was performed.  A Foley catheter was inserted into the bladder.  A site was then selected 18 cm from the pubic symphysis and just to the  left of the umbilicus for placement of the camera port.  This was placed  using a standard open Hassan technique.  This allowed  entry into the  peritoneal cavity under direct vision.  A 12 mm port was then placed and  the pneumoperitoneum was established.  The 0 degree lens was then used  to inspect the abdomen and there was no evidence of any intra-abdominal  injuries or other abnormalities.  Attention then turned to placement of  the remaining ports.  Bilateral 8 mm robotic ports were placed 16 cm  from the pubic symphysis and 10 cm lateral to the camera port.  An  additional 8 mm port was placed in the far left lateral abdominal wall.  A 5 mm port was placed between the camera port and the right robotic  port.  An additional 12 mm port was placed in the far right lateral  abdominal wall.  All laparoscopic ports were placed under direct vision  and without difficulty.  The surgical cart was then docked.  With the  aid of the cautery scissors, the bladder was reflected posteriorly  allowing entry into the space of Retzius and identification of the  endopelvic fascia and prostate.  The endopelvic fascia was then incised  from the apex back to the base of the prostate bilaterally allowing the  underlying levator muscle fibers to be swept laterally off the prostate.  This isolated the dorsal venous complex which was then stapled and  divided with a 45 mm Flex EPS stapler.  The bladder neck was identified  with the aid of Foley catheter and manipulation and divided anteriorly,  thereby exposing the Foley catheter.  The Foley catheter balloon was  then deflated and the catheter was brought into the operative field and  used to retract the prostate anteriorly.  This exposed the posterior  bladder neck which was incised and dissection continued between the  bladder neck and prostate until the vasa deferentia and seminal vesicles  were identified.  The vasa deferentia were isolated and divided and  lifted anteriorly.  The seminal vesicles were also dissected free with  care to control the seminal vesicle arterial blood  supply.  The seminal  vesicles were then lifted anteriorly.  The space between Denonvilliers'  fascia and the anterior rectum was then bluntly developed thereby  isolating the vascular pedicles of the prostate.  Lateral prostatic  fascia was then incised bilaterally allowing the neurovascular bundles  to be swept laterally and posteriorly off the prostate.  The vascular  pedicles of the prostate were then ligated with Hem-o-lok clips and  divided with sharp cold scissor dissection above the level of the  neurovascular bundles.  The neurovascular bundles were then swept off  the apex of the prostate.  The urethra was then sharply divided allowing  the prostate specimen to be disarticulated and placed up into the  abdomen for later removal.  The pelvis was then copiously irrigated.  Hemostasis was ensured and there did not appear to be any injury to the  rectum.  Attention then turned to the urethral anastomosis.  A 2-0  Vicryl slip-knot was placed at the 6 o'clock position between the  bladder neck and urethra to reapproximate these structures.  A double-  armed 3-0 Monocryl suture was then used to perform a 360 degree running  tension-free anastomosis between the bladder neck and urethra.  A new 20-  [place] coude catheter was inserted into the bladder and irrigated.  There were no blood clots within the bladder and the anastomosis  appeared to be watertight.  A #19 Blake drain was then brought through  the left robotic port and appropriately positioned in the pelvis.  It  was secured to the skin with a nylon suture.  Due to the fact that there  was some oozing from the neurovascular bundle on the right side, a piece  of Surgicel was left toward the area where the base of the prostate had  been.  The surgical cart was then undocked.  The prostate specimen was  placed into an Endopouch retrieval bag via the periumbilical port site for later removal.  A 0 Vicryl suture was used to close the  right  lateral 12 mm port site with the aid of the suture passer device.  All  ports were then removed under direct vision.  The prostate specimen was  removed via the periumbilical port site and this fascial opening was  closed with a running 0 Vicryl suture.  All ports were injected with  0.25% Marcaine and reapproximated at the skin level with staples.  Sterile dressings were applied.  The patient appeared to tolerate the  procedure well and without complications.  He  was able to be extubated  and transferred to the recovery unit in satisfactory condition.           ______________________________  Heloise Purpura, MD  Electronically Signed     LB/MEDQ  D:  10/03/2006  T:  10/03/2006  Job:  [id number]

## 2011-02-02 NOTE — Op Note (Signed)
[place]  Patient:    [name], [name]                    MRN: [id number] Proc. Date: 03/06/01 Adm. 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  OPERATION PERFORMED:  Hemilaminotomy at L3-4 on the left side with a foraminotomy and diskectomy; re-exploration on the left at L4-5 with diskectomy and foraminotomy and medial facetectomy.  SURGEON:  Izell Granger. Elesa Hacker, M.D.  ASSISTANT:  Clydene Fake, M.D.  ANESTHESIA:  General endotracheal anesthesia, the usual subperiosteal approach was carried out on the left side to the L3-L4 interlaminar space.  Dissection was then extended inferiorly through epidural fibrotic tissue to expose the L4-L5 interlaminar space.  X-rays were taken to ensure the proper operative levels.  Utilizing the operating microscope and high speed drill, a hemilaminotomy was done initially at L3-4.  Dissection was carried laterally far enough to include medial facetectomy for release of lateral recess stenosis.  The dural sac and nerve root were identified and retracted medially over a disk protrusion.  This was particularly prominent laterally.  This was also in association with spondylitic bony spurring.  The annulus was incised and a rather large quantity of degenerative disk material was removed.  The spondylitic bone spurs were removed with Epstein curets and the osteophyte removal instrument.  Once decompression had been obtained here, a similar procedure was carried out at L4-5, which  proved to be slightly more difficult because this had been previously operated on.  The disk was identified and care taken to protect the dura and nerve root from surrounding epidural tissue.  The disk was incised and again, a large quantity of degenerative material was removed.  Care was taken to decompress the foramen and lateral aspect of the disk on the left side.  Once the decompression was completed, the wound was copiously irrigated with antibiotic solution.  The wound was then closed in layers and a sterile dressing was applied.  The patient was taken to the recovery room in good condition.  INDICATIONS FOR PROCEDURE:  DESCRIPTION OF PROCEDURE: DD:  03/06/01 TD:  03/07/01 Job: [id number]

## 2011-02-02 NOTE — H&P (Signed)
. [facility]  Patient:    [name], [name]                    MRN: [id number] Adm. Date:  [id number] Attending:  Osvaldo Human CC:         Yates Decamp, M.D.; [place]   History and Physical  HISTORY:  [name] is a 70 year old gentleman with a history of coronary artery disease status post coronary artery bypass grafting in September 2001. He is now admitted to the hospital with recurrent episodes of chest pain.  The patient has had intermittent episodes of chest discomfort since his surgery.  He was seen in the office several weeks ago and was seen again yesterday with some vague chest pains.  His EKG was non-acute at that time. He presents today with recurrent episodes of chest pain that were partially relieved with sublingual nitroglycerin.  The patient also has a history of hypertension.  He has had some problems with orthostatic hypotension over the past month or so.  The patient denies any episode of syncope or presyncope.  CURRENT MEDICATIONS: 1. Atenolol 25 mg b.i.d. 2. Xanax 0.25 mg q.h.s. 3. Altace 5 mg p.o. b.i.d. 4. Potassium chloride 20 mEq q.d. 5. Hydrochlorothiazide 25 mg q.d.  ALLERGIES:  WELLBUTRIN.  PAST MEDICAL HISTORY: 1. Hypertension. 2. Coronary artery disease status post coronary artery bypass grafting. 3. History of cigarette smoking.  SOCIAL HISTORY:  The patient stopped smoking several months ago.  He does not drink alcohol.  FAMILY HISTORY:  Unremarkable.  REVIEW OF SYSTEMS:  He denies any recent PND, orthopnea.  He denies any syncope or presyncope.  Review of systems is otherwise unremarkable.  PHYSICAL EXAMINATION  GENERAL:  He is a middle aged gentleman in no acute distress.  VITAL SIGNS:  Blood pressure 164/77, heart rate 79, temperature 97.8.  NECK:  Carotids 2+, but no JVD.  LUNGS:  Clear to auscultation.  HEART:  Regular rate.  S1, S2.  PMI is nondisplaced.  ABDOMEN:  Good bowel  sounds and nontender.  EXTREMITIES:  No cyanosis, clubbing, or edema.  Pulses are 2+.  Calves are nontender.  SKIN:  Warm and dry.  NEUROLOGIC:  Cranial nerves 2-12 are intact.  Motor and sensory function are intact.  LABORATORIES:  EKG reveals normal sinus rhythm.  He has no acute ST or T-wave changes.  Laboratory data is pending.  [name] presents with some episodes of chest pain.  His EKG is non-acute. He continues to have intermittent episodes of chest pain.  For further evaluation we will proceed with heart catheterization tomorrow.  We have discussed the risks, benefits, and options of heart catheterization.  He understands and agrees to proceed. DD:  03/18/01 TD:  03/18/01 Job: [id number]

## 2011-02-02 NOTE — Cardiovascular Report (Signed)
[place]  Patient:    [name], [name]                    MRN: [id number] Proc. Date: 03/19/01 Adm. Date:  [id number] Attending:  Koren Bound CC:         Cath Lab  Yates Decamp, M.D., [place], [place]   Cardiac Catheterization  INDICATIONS:  Mr. Neas is a 70 year old gentleman with a history of coronary artery disease, status post coronary artery bypass grafting.  He is referred for a heart catheterization after having repeated episodes of chest pain.  PROCEDURE:  Left heart catheterization with coronary angiography.  HEMODYNAMICS:  The left ventricular pressure was 135/15, aortic pressure 130/67.  ANGIOGRAPHY:  The left main coronary artery is fairly smooth and normal. There are a few minor luminal irregularities.  The left anterior descending artery is diffusely diseased.  There is a proximal 60 to 70% stenosis.  The mid LAD has a 70% stenosis.  The distal LAD has minor luminal irregularities.  There is competitive flow from the IMA. The first diagonal branch is a relatively small vessel and is approximately 1 mm in size.  There is a tight 95% stenosis in this vessel.  The left circumflex artery is a moderate size vessel.  The obtuse marginal arteries are occluded.  The circumflex artery gives off a terminal posterolateral branch which has only minor luminal irregularities.  The right coronary artery is a large and dominant vessel.  The proximal RCA has a very complex eccentric ruptured plaque.  This plaque represents a 75 to 80% stenosis.  It is basically unchanged from his first heart catheterization back in September.  This lesion does not appear to alter the flow down the vessel.  The posterior descending artery and the posterolateral segment arteries has minor luminal irregularities.  The saphenuos vein graft to the diagonal vessel is fairly normal.  The native diagonal vessel is quite small.  The saphenuos vein  graft to the obtuse marginal artery has a proximal 30 to 35% stenosis.  The graft is otherwise unremarkable.  The right internal mammary artery to the RCA was visualized in a nonselected manner.  It is atretic and does not supply any significant flow down to the distal right coronary artery.  The left internal mammary artery to LAD graft is fairly normal.  It is normal in its origin and the anastomosis is patent.  There is perhaps a 20% stenosis just after the IMA enters the LAD.  The left ventriculogram was performed in a 30 RAO position.  It reveals inferobasilar akinesis.  The overall ejection fraction is at the lower limits of normal and is 50%.  There was no mitral regurgitation.  COMPLICATIONS:  None.  CONCLUSION: 1. Severe native coronary artery disease. 2. The right internal mammary to right coronary artery graft is occluded.    It appears that the right mammary artery is now atretic.  The right    coronary artery obtains its flow through the ruptured eccentric plaque    in the proximal right vessel.  This area is quite complex and angioplasty    and/or stenting of this lesion would be somewhat risky.  We will continue    with medical therapy.  If he fails maximal medical therapy, then we will    proceed with intervention of this site.  He also has a tight stenosis in a very small first diagonal vessel.  I do not that this  vessel will respond well to angioplasty.  We will continue to treat this area conservatively. DD:  03/19/01 TD:  03/19/01 Job: [id number]

## 2011-02-02 NOTE — H&P (Signed)
[place]  Patient:    [name], [name]                    MRN: [id number] Adm. Date:  [id number] Disc. Date: [id number] Attending:  Doug Sou                         History and Physical  CHIEF COMPLAINT: Mr. Colonel Krauser is a 70 year old man, seen through the courtesy of Dr. Yates Decamp.  Mr. Schueler has a chief complaint of low back pain with radiating pain down his left leg that started about 2-1/2 months or so ago and has been progressively worse since the onset.  HISTORY OF PRESENT ILLNESS: It has gotten to the point that with any sort of activity he is virtually incapacitated.  He gets some minor pain relief when he is less active and a clear increase in pain when he tries any sort of activity.  He has a pertinent past medical history of having been seen and operated on for disk problems on two occasions, the first in 1985 by Dr. Elyse Hsu in [place], [place], and the second operation in December 2000 by Dr. Ruthann Cancer, also in [place], [place].  Prior to his coming here, he went back to see Dr. Gerrit Heck in his office and was seen by the P.A. there, who ordered an MRI.  Following that, he elected to come to our office and we saw him initially on December 24, 2000.  He has been employed at [employer] for the last 20 years or so and does not have to do any heavy lifting but in spite of that he is still having difficulty sufficient to keep him out of work.  His right leg is uninvolved and he has had no problem with bowel or bladder control.  PAST MEDICAL HISTORY:  1. History of high blood pressure.  2. Coronary artery bypass.  FAMILY HISTORY: His mother died at age 50 of cancer and his father died at age 80 of unknown causes.  He has a 28 year old brother who has a history of cancer and also bypass surgery.  He has a 43 year old brother, upon whom we have operated for back problems and he has also had bypass  surgery.  He has a 54 year old sister who has had back surgery and bypass surgery.  He has a 58 year old sister who has had back problems and also has multiple sclerosis. He has one 78 year old sister living and well.  He has two children, age 30 and 60, living and well.  PAST SURGICAL HISTORY:  1. Back surgery as mentioned.  2. Bypass surgery as noted above, which was done in 2001 at [facility].  CURRENT MEDICATIONS:  1. Atenolol.  2. Norvasc.  3. Altace.  4. Aspirin.  5. Propoxyphene with Tylenol.  ALLERGIES: He knows of no medical allergies.  SOCIAL HISTORY: He does not smoke or use alcohol.  REVIEW OF SYSTEMS: Pertinent as already covered.  PHYSICAL EXAMINATION:  GENERAL: He is alert and cooperative.  VITAL SIGNS: Weight 194 pounds.  Height 5 feet 7 inches.  Blood pressure 137/87, pulse 70.  He is afebrile.  HEENT/NECK: Essentially within normal limits, with no adenopathy or mass lesions in the neck or supraclavicular regions.  CHEST: Clear for the most part bilaterally.  CARDIAC: No murmurs.  ABDOMEN: Soft, nontender, no palpable masses.  NEUROLOGIC: Range of motion of  the lumbosacral spine is somewhat limited on forward bending and also with increased pain when he tilts to the left side. He has no palpable adenopathy or mass lesions in his back, buttocks, or legs on careful examination.  He is able to walk on heel and toe at this time but I think he does have suggestion of dorsiflexion weakness involving the great toe on the left and also I questioned whether he has weakness of his hip flexors, but part of this may be pain produced by testing as well.  Straight leg raising is painful on the left to some extent and negative on the right.  Deep tendon reflexes were hypoactive throughout today but there is no asymmetry. Gross sensory examination shows diminished sensation over the left lateral thigh and to some extent over the calf and  dorsum of the foot.  LABORATORY DATA: This man initially underwent an MRI and this showed scarring and a question of disk bulging.  He then had a myelogram done which demonstrated foraminal stenosis on the left side at L3-4 and L4-5 with disk protrusion at L3-4 and either scar or a combination of scar and recurrent disk at L4-5.  IMPRESSION: As noted above.  PLAN: He is admitted for surgical decompression at L3-4 and L4-5 on the left side. DD:  03/05/01 TD:  03/06/01 Job: [id number]

## 2011-02-02 NOTE — Discharge Summary (Signed)
[name], [name]             ACCOUNT NO.:  [id number]   MEDICAL RECORD NO.:  [id number]          PATIENT TYPE:  INP   LOCATION:  1432                         FACILITY:  [facility]   PHYSICIAN:  Heloise Purpura, MD      DATE OF BIRTH:  1941/05/28   DATE OF ADMISSION:  10/03/2006  DATE OF DISCHARGE:  10/05/2006                               DISCHARGE SUMMARY   ADMISSION DIAGNOSIS:  Prostate cancer.   DISCHARGE DIAGNOSIS:  Prostate cancer.   HISTORY AND PHYSICAL:  For full details, please see admission history  and physical.  Briefly, [name] is a 70 year old gentleman with  clinical stage T2-a prostate cancer with a Gleason score 3 +3 = 6 and a  PSA of 4.8.  After discussing management options for prostate cancer,  the patient elected to proceed with surgical therapy.   HOSPITAL COURSE:  On October 03, 2006, the patient was taken to the  operating room and underwent a robotic-assisted laparoscopic radical  prostatectomy.  He tolerated this procedure well and without  complications.  Postoperatively, he was able to be transferred to a  regular hospital room following recovery from anesthesia.  The evening  of surgery, he was monitored with a cardiac monitor due to his history  of coronary artery disease.  He had received a preoperative evaluation  by Dr. Elease Hashimoto and was felt to be a low risk from a cardiac standpoint.  Nonetheless, he was monitored closely.  By the evening of postoperative  day 0, he was dizzy and was unable to ambulate well.  His blood pressure  was moderately low over night, and he was maintained on intravenous  fluids.  His hemoglobin levels remained stable on postoperative day #1,  and there was no evidence of any postoperative bleeding.  He was able to  begin ambulating on postoperative day #1 and was able to begin a clear  liquid diet.  He maintained excellent urine output from his Foley  catheter with minimal output from his pelvic drain which was  therefore  removed.  By late in the afternoon on postoperative day #1, the patient  was much improved but still not ambulating as well as necessary.  He was  therefore monitored overnight and on the morning of postoperative day #2  was ambulating well, was tolerating a clear liquid diet and his pain was  well-controlled with oral pain medication.  He was therefore able to be  discharged home in excellent condition.   DISPOSITION:  Home.   DISCHARGE MEDICATIONS:  The patient was instructed to resume his regular  home medications excepting any aspirin, nonsteroidal anti-inflammatory  drugs, or herbal supplements.  He was given a prescription to take  Vicodin as needed for pain and told to use Colace as a stool softener.  He was also given a prescription to begin Cipro 1 day prior to his  return visit for Foley catheter removal.   DISCHARGE INSTRUCTIONS:  The patient was instructed to refrain from any  heavy lifting or strenuous activity or driving.  He was, however,  encouraged to be ambulatory and encouraged to walk.  He was instructed  on routine Foley catheter care and given a leg bag for daytime usage.   FOLLOW UP:  [name] will follow-up in 1 week for removal of his  Foley catheter and to discuss his surgical pathology in detail.           ______________________________  Heloise Purpura, MD  Electronically Signed     LB/MEDQ  D:  10/05/2006  T:  10/05/2006  Job:  [id number]

## 2011-02-02 NOTE — Discharge Summary (Signed)
[place]. [facility]  Patient:    [name], [name]                    MRN: [id number] Adm. Date:  [id number] Disc. Date: 03/19/01 Attending:  Koren Bound CC:         Yates Decamp, M.D., [place], [place]   Discharge Summary  DISCHARGE DIAGNOSES: 1. Coronary artery disease, status post coronary artery bypass graft. 2. 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 3. History of back surgery.  DISCHARGE MEDICATIONS: 1. Enteric-coated aspirin 325 mg a day. 2. Imdur 30 mg a day. 3. Tenormin 25 mg a day. 4. Altace 5 mg a day. 5. Plavix 75 mg a day. 6. Protonix 40 mg a day. 7. Nitroglycerin 0.4 mg sublingually.  DISPOSITION:  The patient will see Dr. Elease Hashimoto on Monday for a stress test.  DISCHARGE INSTRUCTIONS:  He is to watch his leg for bleeding.  DIET:  He is to eat a low-fat, low-salt, and low-cholesterol diet.  HISTORY OF PRESENT ILLNESS:  Mr. [name] is a middle-aged gentleman with a history of coronary artery disease, status post coronary artery bypass grafting.  He presents with episodes of chest pain.  Please see dictated H&P for further details.  HOSPITAL COURSE:  Coronary artery disease.  The patient had a heart catheterization which revealed an atretic right internal mammary artery.  He did have some mild irregularities of some rather small vessels but these did not appear to be significant.  It was thought that perhaps the 70-80% proximal right coronary artery stenosis could be treated medically.  This lesion is a fairly high-risk lesion for angioplasty.  We will discharge him to home on medical therapy and we will do a stress test next week.  If he is found to have a significant ischemia involving the right coronary artery distribution, then we will proceed with PTCA and stenting of the right coronary artery.  The remainder of his problems are relatively stable. DD:  03/21/01 TD:   03/21/01 Job: [id number]

## 2011-02-02 NOTE — Discharge Summary (Signed)
   NAME:  [name], [name]                       ACCOUNT NO.:  [id number]   MEDICAL RECORD NO.:  [id number]                   PATIENT TYPE:  INP   LOCATION:  3731                                 FACILITY:  [facility]   PHYSICIAN:  Jorja Empie DICTATOR                    DATE OF BIRTH:  [birth year]   DATE OF ADMISSION:  06/15/2002  DATE OF DISCHARGE:                                 DISCHARGE SUMMARY   DISCHARGE DIAGNOSES:  1. Noncardiac chest pain.  2. History of coronary artery disease with coronary artery bypass grafting.  3. Hypertension.  4. Status post cholecystectomy.  5. Viral illness with diarrhea.   DISCHARGE MEDICATIONS:  1. Tenormin 25 mg per day.  2. Altace 5 mg per day.  3. Potassium chloride 20 mEq daily.  4. Imdur 30 mg daily.  5. Enteric coated aspirin 325 mg per day.  6. Imodium as needed over-the-counter.   DISPOSITION:  The patient is to eat a low fat, low salt, and low cholesterol  diet. He is to watch for any signs of bleeding. He is to see Dr. Elease Hashimoto in  several weeks.   HISTORY OF PRESENT ILLNESS:  [name] is a 70 year old gentleman with  history of coronary artery disease with coronary artery bypass grafting and  PTCA and Stenting of his right coronary artery. He presented to the hospital  on June 15, 2002 with episode of chest pain. Please see dictated  history and physical for further details of hospital course.   PROBLEM LIST:  Chest pain. The patient ruled out for myocardial infarction  with serial CPK's. Because of his persistent and recurrent symptoms, we  decided to go ahead and perform a heart catheterization. The catheterization  revealed mildly moderate irregularities but with no critical stenosis. He  did have a mid LAD stenosis that was probably 50-60% after the insertion of  the left internal mammary artery but this region received competitive flow.  The flow seems to be quite brisk. His other coronary arteries were patent.  The right  internal mammary artery was known to be atretic with very poor  flow but his native coronary artery has been stented and has brisk flow. The  patient will be discharged on the above noted medications. We essentially  have made no changes in his medications. I suspect that his chest pains are  due to GI origin or perhaps a musculoskeletal origin. He will follow-up in  the office in several weeks.                                               Traye Bates DICTATOR   DD/MEDQ  D:  06/17/2002  T:  06/20/2002  Job:  [id number]   cc:   Yates Decamp

## 2011-02-02 NOTE — Discharge Summary (Signed)
[place]. [facility]  Patient:    [name], [name]                    MRN: [id number] Adm. Date:  [id number] Disc. Date: [id number] Attending:  Charlett Lango Dictator:   Maxwell Marion, RNFA CC:         Yates Decamp, M.D., [facility]  Vesta Mixer, Montez Hageman., M.D.   Discharge Summary  DATE OF BIRTH:  06/30/1941  DATE OF SURGERY:  May 31, 2000  ADMISSION DIAGNOSIS:  Chest pain/unstable angina.  PAST MEDICAL HISTORY:  Tobacco use.  DISCHARGE DIAGNOSIS:  Coronary artery disease, status post coronary artery bypass grafting.  ALLERGIES:  WELLBUTRIN causes hand swelling and hives.  HOSPITAL COURSE:  On May 27, 2000, Mr. Nace presented to the [facility] Emergency Room complaining of chest pain, bilateral arm heaviness, and dyspnea.  He was seen in consultation by Dr. Elease Hashimoto, who recommended cardiac catheterization.  This was performed on May 28, 2000, and revealed significant three vessel coronary artery disease, with an ejection fraction of 50%.  Cardiac surgery consult was obtained by Dr. Dorris Fetch, who recommended coronary artery bypass grafting.  On May 29, 2000, preoperative Dopplers revealed no significant carotid artery disease, and he was noted to have palpable pedal pulses bilaterally. On May 31, 2000, Mr. Janee Morn underwent an uncomplicated coronary artery bypass grafting x 4 with Dr. Dorris Fetch.  Grafts placed at the time of procedure:  Left internal mammary artery to the left anterior descending artery, right internal mammary artery to the right coronary artery, saphenous vein was grafted to the OM 1, saphenous vein was grafted to the diagonal.  At the completion of the procedure, he was transferred in stable condition to the SICU.  Mr. Wedin postoperative course has been uneventful.  He has made excellent progress, and Dr. Dorris Fetch anticipates discharge for  tomorrow, June 04, 2000.  DISCHARGE MEDICATIONS: 1. Enteric-coated aspirin 325 mg q.d. 2. Darvocet-N 100 1-2 p.o. q.4-6h. p.r.n. 3. Atenolol 25 mg 1/2 tablet q.12h. 4. Lasix 40 mg q.d. x 14 days. 5. KCl 20 mEq q.d. x 14 days.  FOLLOW-UP:  Mr. Maya is instructed to call Dr. Namon Cirri office to make an appointment to see him in two weeks.  He was given an appointment to see Dr. Dorris Fetch on July 03, 2000, and he will have a chest x-ray taken at [facility] that day also. DD:  06/03/00 TD:  06/05/00 Job: [id number]

## 2011-02-02 NOTE — Cardiovascular Report (Signed)
NAME:  [name], [name]                       ACCOUNT NO.:  [id number]   MEDICAL RECORD NO.:  [id number]                   PATIENT TYPE:  OBV   LOCATION:  2039                                 FACILITY:  [facility]   PHYSICIAN:  Vesta Mixer, M.D.              DATE OF BIRTH:  Nov 30, 1940   DATE OF PROCEDURE:  05/24/2004  DATE OF DISCHARGE:                              CARDIAC CATHETERIZATION   [name] is a 70 year old gentleman with history of coronary artery  disease.  He is status post coronary artery bypass grafting.  He is also  status post percutaneous transluminal coronary angioplasty and stenting of  his proximal right coronary artery.  He presents early this morning with  episodes of chest pain.  The episodes of chest pain radiated up into his  neck and out both arms.  He was referred for heart catheterization based on  these findings.    The right femoral artery was easily cannulated using the modified Seldinger  technique.   HEMODYNAMICS:  The left ventricular pressure was 121/7 with an aortic  pressure of 122/71.   CORONARY ANGIOGRAPHY:  1.  Left main coronary artery is fairly normal.  2.  The left anterior descending artery has moderate diffuse disease.  There      is competitive flow down the distal LAD.  3.  The left circumflex artery has mild-to-moderate irregularities.  The      first obtuse marginal artery is occluded.  4.  The right coronary artery has mild luminal irregularities.  The proximal      stent is widely patent.  There is a moderate stenosis in the mid lesion.      The tightest stenosis is perhaps 20-25% in severity.  The distal RCA has      mild irregularities.  5.  The right internal mammary artery is very atretic and does not supply      any significant flow down to the coronary bed.  The left internal      mammary artery is widely patent.  It supplies the distal LAD very      nicely.  6.  The saphenous vein graft to the diagonal vessel is  normal.  The      saphenous vein graft to the circumflex marginal vessel is normal.   LEFT VENTRICULOGRAM:  Left ventriculogram was performed in a 30-RAO  position.  It reveals left ventricular systolic function that is at the  lower limits of normal.  He has no segmental wall motion abnormalities.   COMPLICATIONS:  None.   CONCLUSIONS:  Mild-to-moderate coronary artery disease.  He has a very  atretic right internal mammary artery, but his native right coronary artery  has been stented and only has mild-to-moderate irregularities.  He has no  significant stenoses that need further angioplasty at this time.  We will  continue with medical therapy.  Vesta Mixer, M.D.    PJN/MEDQ  D:  05/24/2004  T:  05/24/2004  Job:  [id number]

## 2011-02-02 NOTE — Cardiovascular Report (Signed)
[place]. [facility]  Patient:    [name], Brian Visit Number: [id number] MRN: [id number]          Service Type: CAT Location: [facility] 2899 [id number] Attending Physician:  Koren Bound Dictated by:   Alvia Grove., M.D. Proc. Date: 05/09/01 Adm. Date:  05/09/2001   CC:         Yates Decamp, M.D.   Cardiac Catheterization  INDICATIONS:  The patient is a 70 year old gentleman, with a history of coronary artery disease - status post coronary artery bypass grafting and recent PTCA and stenting of his right coronary artery.  He presents with recurrent chest pain that is minimally responsive to nitroglycerin.  PROCEDURES:  Left heart catheterization with coronary angiography.  The right femoral artery was easily cannulated using a modified Seldinger technique.  HEMODYNAMICS:  The left ventricular pressure is 128/22 with a aortic pressure of 130/73.  ANGIOGRAPHY:  Left main coronary artery:  The left main coronary artery has minor luminal irregularities.  The previously seen dissection (from before surgery) is now healed.  The left anterior descending artery:  There are moderate diffuse irregularities in the proximal and mid segments between 50-60%.  There is competitive flow from the IMA.  The diagonal vessel has a severe 90-95% stenosis and is then occluded.  Left circumflex artery:  The circumflex artery is normal proximally.  The obtuse marginal artery is occluded proximally.  The distal left circumflex artery has minor luminal irregularities.  The right coronary artery:  The proximal right coronary artery stent is widely patent.  There is a mid 50% stenosis.  There are distal irregularities.  There is brisk flow down the right coronary artery.  Right internal mammary artery to RCA graft:  This graft is atretic.  The innominate artery is normal.  The saphenous vein graft to the first diagonal artery:  The graft is normal. The  anastomosis is normal.  Saphenous vein graft to obtuse marginal artery:  The graft and anastomosis are normal.  Left IMA:  The left internal mammary artery is widely patent.  The anastomosis to the LAD is widely patent.  There are minor to moderate irregularities in the distal LAD after the anastomosis.  There is competitive flow from the native LAD.  LEFT VENTRICULOGRAM:  There is inferobasilar akinesis but the left ventricular systolic function is overall normal.  Ejection fraction is 55-60%.  There is trace to mild mitral regurgitation.  COMPLICATIONS:  None.  CONCLUSIONS:  Patent right coronary artery stent.  The saphenous vein graft to the first diagonal artery obtuse marginal artery and left internal mammary artery to the left anterior descending are widely patent.  He has overall well preserved left ventricular systolic function.  He probably has noncardiac chest pain.  We will check a gallbladder ultrasound.  We will continue medical therapy. Dictated by:   Alvia Grove., M.D. Attending Physician:  Koren Bound DD:  05/09/01 TD:  05/10/01 Job: [id number]

## 2011-02-02 NOTE — H&P (Signed)
[place]. [facility]  Patient:    [name], [name]                    MRN: [id number] Adm. Date:  [id number] Disc. Date: [id number] Attending:  Koren Bound CC:         Alvia Grove., M.D.  Colleen Can. Deborah Chalk, M.D.  Yates Decamp, M.D., [place]   History and Physical  REASON FOR ADMISSION:  Prolonged chest pain.  HISTORY OF PRESENT ILLNESS:  Mr. Mozer is 27 and has a history of coronary artery disease.  He underwent coronary artery bypass grafting in September 2001.  He had  RIMA to the RCA, LIMA to the LAD, saphenous vein graft to the diagonal, and saphenous vein graft to the obtuse marginals.  In early July, he began noticing intermittent episodes of chest discomfort that were increasing in frequency since surgery.  Catheterization was performed by Dr. Elease Hashimoto because of these episodes of discomfort on March 18, 2001. Catheterization demonstrated atresia of the RIMA to the RCA, patency of saphenous vein graft to the obtuse marginal and the diagonal, and patency of the LIMA to the LAD.  Dr. Elease Hashimoto also felt that there was an intimal dissection in the proximal right coronary.  Medical therapy was prescribed. Early this morning, the patient had prolonged substernal chest discomfort unrelieved by multiple nitroglycerin tablets and I instructed him to come to the emergency room.  Now, on IV nitroglycerin, he is pain free.  CURRENT MEDICATIONS: 1. Nexium 40 mg per day. 2. Imdur 30 mg per day. 3. Xanax 0.25 mg t.i.d. p.r.n. 4. Tenormin 25 mg b.i.d. 5. Plavix 75 mg per day. 6. Altace 5 mg b.i.d. 7. Zocor 40 mg per day. 8. Potassium 20 mEq per day. 9. Multivitamin 1 per day.  ALLERGIES:  WELLBUTRIN.  SIGNIFICANT MEDICAL PROBLEMS:  Hypertension, coronary artery disease as listed.  FAMILY HISTORY:  Please refer to previous evaluations/H&Ps.  HABITS:  Long-term smoker, but has not smoked recently.  He does not  drink alcohol.  REVIEW OF SYSTEMS:  Unremarkable.  PHYSICAL EXAMINATION:  GENERAL:  On examination, the patient is somewhat pale appearing.  VITAL SIGNS:  His blood pressure is 100/60, heart rate is 90.  HEENT:  Unremarkable.  Extraocular movements are full.  NECK:  No JVD, carotid bruits, or thyromegaly.  LUNGS:  Clear to auscultation and percussion.  CARDIAC:  Noticeable gallop and 1/6 systolic murmur.  No diastolic murmurs.  ABDOMEN:  Soft.  Liver and spleen are not palpable.  Bowel sounds are normal.  EXTREMITIES:  No edema.  Pulses are 2+ and symmetric in the upper and lower extremities.  NEUROLOGICAL:  Normal.  No motor or sensory deficits are noted.  EKG:  This does not reveal significant acute abnormality.  There are small inferior Q-waves, consistent with inferior infarction.  CHEST X-RAY:  Reportedly unremarkable.  ASSESSMENT: 1. Prolonged chest pain.  No acute EKG changes are noted.  The pain does have    ischemic quality. 2. Hypertension, controlled. 3. Hyperlipidemia, on therapy.  PLAN: 1. IV nitroglycerin. 2. IV heparin. 3. Consider IIb/IIIa inhibitor. 4. Reevaluate patients coronary angiograms and consider intervention on    the native right coronary artery. DD:  04/05/01 TD:  04/06/01 Job: [id number]

## 2011-02-02 NOTE — Op Note (Signed)
[facility]  Patient:    [name], [name] Visit Number: [id number] MRN: [id number]          Service Type: SUR Location: 4W 0451 01 Attending Physician:  Delsa Bern Proc. Date: 06/20/01 Admit Date:  06/20/2001 Discharge Date: 06/21/2001                             Operative Report  PREOPERATIVE DIAGNOSIS:  Symptomatic cholelithiasis.  POSTOPERATIVE DIAGNOSIS:  Symptomatic cholelithiasis.  SURGICAL PROCEDURE:  Laparoscopic cholecystectomy.  SURGEON:  Lorne Skeens. Hoxworth, M.D.  ASSISTANT:  Donnie Coffin. Samuella Cota, M.D.  ANESTHESIA:  General.  BRIEF HISTORY:  [name] is a 70 year old male, who presented several weeks ago with an acute episode of right upper quadrant and low substernal chest pain.  Cardiac evaluation was negative, and gallbladder ultrasound subsequently revealed cholelithiasis.  He was felt to have symptomatic gallstones, and laparoscopic cholecystectomy has been recommended and accepted.  The nature of the procedure, its indications, risks of bleeding, infection, bile leak, and bile duct injury were discussed and understood preoperatively.  He is now brought to the operating room for this procedure.  DESCRIPTION OF PROCEDURE:  The patient was brought to the operating room, placed in the supine position on the operating table, and general endotracheal anesthesia was induced.  He received preoperative antibiotics. The abdomen was sterilely prepped and draped.  Local anesthesia was used to infiltrate the trocar sites prior to the incisions.  A 1 cm incision was made at the umbilicus and dissection carried down to the midline fascia.  This was sharply incised for 1 cm and the peritoneum entered under direct vision.  Through a mattress suture of 0 Vicryl, the Hasson trocar was placed and pneumoperitoneum established.  Under direct vision, a 10 mm trocar was placed in the subxiphoid area, and two 5 mm trocars along the  right subcostal margin.  There were a few adhesions of omentum to the anterior abdominal wall and the right upper quadrant, and these were taken down.  The gallbladder was exposed.  It was not acutely inflamed.  The fundus was grasped and elevated up over the liver. Some chronic omental adhesions to the gallbladder were taken down.  The infundibulum exposed and retracted inferolaterally.  Fibrofatty tissue was stripped off at the neck of the gallbladder toward the porta hepatis.  The distal gallbladder was thoroughly dissected as was Calots triangle.  The cystic duct was identified and the cystic duct gallbladder junction dissected 360 degrees.  When the anatomy was clear, the cystic duct was triply clipped proximally, clipped distally, and divided.  The cystic artery was seen in Calots triangle, and it was divided between two proximal and one distal clips.  Following this, the gallbladder was dissected free using hook cautery and removed through the umbilicus.  The right upper quadrant was thoroughly irrigated and inspected for hemostasis which was complete.  The trocars were removed under direct vision and all CO2 evacuated from the peritoneal cavity. The mattress suture was secured at the umbilicus.  Skin incisions were closed with interrupted subcuticular 4-0 Monocryl and Steri-Strips.  Sponge, needle and instrument counts were correct.  Dry sterile dressings were applied, and the patient was taken to the recovery in good condition. Attending Physician:  Delsa Bern DD:  06/20/01 TD:  06/21/01 Job: [id number]

## 2011-02-02 NOTE — Cardiovascular Report (Signed)
[place]. [facility]  Patient:    [name], [name]                    MRN: [id number] Proc. Date: 05/28/00 Adm. Date:  [id number] Attending:  Koren Bound CC:         Yates Decamp, M.D., [facility]  Cardiac Catheterization Laboratory   Cardiac Catheterization  INDICATIONS:  Mr. Minehart is a 70 year old gentleman.  He presented to the [facility] Emergency Room after having an episode of chest pain while at work yesterday.  He had some mild ECG abnormalities.  He is referred for heart catheterization for further evaluation.  DESCRIPTION OF PROCEDURE:  The right femoral artery was easily cannulated using the modified Seldinger technique.  HEMODYNAMICS:  The left ventricular pressure was 112/13 with an aortic pressure of 111/64.  ANGIOGRAPHY:  The left main coronary artery is relatively smooth and normal.  The left anterior descending artery is a diffusely diseased vessel.  It gives off a high first diagonal vessel which bifurcates fairly early on.  The proximal LAD has diffuse 50-75% stenosis throughout its course.  There is a 75% just distal to the takeoff of the first diagonal vessel.  The distal LAD has moderate irregularities between 20-30% but overall was a fairly good target.  The diagonal branch is a large vessel which branches fairly quickly.  In the medial most branch is a proximal 90% followed by an 80% stenosis.  In the lateral branch is a proximal 95% stenosis.  The left circumflex artery is a fairly vessel.  It gives off a fairly large first obtuse marginal artery which has a 95% stenosis proximally.  The distal left circumflex is only a moderate sized vessel and supplies a small posterolateral branch.  The right coronary artery is large and dominant.  There is a ruptured plaque in the proximal aspect of this vessel which narrows to about 80-90%.  This plaque is obvious a fresh rupture.  There is moderate disease in the  right coronary artery between 40-50%.  The distal right coronary artery has minor irregularities but no critical stenoses.  LEFT VENTRICULOGRAM:  The left ventriculogram was performed in the 30 RAO position.  It reveals inferior wall hypokinesis consistent with a recent myocardial stunning.  The overall ejection fraction is approximately 50%.  COMPLICATIONS:  None.  CONCLUSIONS:  Significant three-vessel coronary artery disease.  We will refer him to [facility] for coronary artery bypass grafting. DD:  05/28/00 TD:  05/29/00 Job: [id number]

## 2011-02-02 NOTE — Discharge Summary (Signed)
[place]. [facility]  Patient:    [name], [name]                    MRN: [id number] Adm. Date:  [id number] Disc. Date: 04/08/01 Attending:  Koren Bound Dictator:   Jennet Maduro Earl Gala, R.N., A.N.P. CC:         Darci Needle, M.D.  Alvia Grove., M.D.  Yates Decamp, [place], [place]   Discharge Summary  PRIMARY DISCHARGE DIAGNOSIS:  Prolonged episodes of chest pain with subsequent stent placement to the native right coronary artery.  SECONDARY DISCHARGE DIAGNOSES: 1. History of coronary artery disease with previous coronary artery bypass    grafting in September of 2001 with right internal mammary artery to the    right coronary, left internal mammary artery to the left anterior    descending, saphenous vein graft to the diagonal, saphenous vein grafts to    the obtuse marginal.  He had recurrent chest pain in early July with    cardiac catheterization demonstrating atresia of the right internal mammary    to the right coronary with the other graft to be patent. There was an    intimal dissection in the proximal right coronary and medical therapy was    prescribed with future plans for probable stenting of the right coronary    artery. 2. Gastroesophageal reflux disease. 3. Anxiety. 4. Hypercholesterolemia. 5. Hypokalemia. 6. Hypertension, currently controlled. 7. Past tobacco abuse. 8. Osteoarthritis.  HISTORY OF PRESENT ILLNESS:  Mr. [name] is a 70 year old male who has known coronary artery disease.  He underwent coronary artery bypass grafting early last fall.  He has had recurrent episodes of chest pain with recent cardiac catheterization with results as described above in early July.  He presents to the hospital with recurrent episodes of chest pain at rest.  He was subsequently seen by Darci Needle, M.D., and admitted for further evaluation.  Please see the dictated history and physical for further patient  presentation and profile.  LABORATORY DATA ON ADMISSION:  Cardiac enzymes were negative.  Chemistries revealed sodium 140, potassium 3.8, chloride 105, BUN 14, creatinine 1.2, glucose 112.  CBC showed hemoglobin 14, hematocrit 39, white count 10, platelets 203.  A 12-lead electrocardiogram showing sinus bradycardia with nonischemic changes.  Chest x-ray showed a left basilar airspace opacity likely representing atelectasis. This was portable film.  There was no edema present.  HOSPITAL COURSE:  The patient was admitted to telemetry.  He was placed on IV nitroglycerin, IV heparin.  He ruled out negative for myocardial infarction. He continued to have recurrent episodes of chest pain and on April 07, 2001, he underwent stenting of the native right coronary with a 3.5 x 15 mm Pinta stent with an overall satisfactory result obtained.  He did receive IV Integrelin post procedure and was subsequently transferred to 6500 for further monitoring and evaluation.  Today on April 08, 2001, he is doing well.  He has had one episode of residual chest discomfort on the previous night that was relieved with IV Nubain.  He is currently pain-free without problems.  Overall physical examination is unremarkable.  Post CK-MB is negative.  Laboratory data this morning is unremarkable except for a potassium of 3.3 and overall he is felt to be stable for discharge today.  DISCHARGE CONDITION:  Improved.  DISCHARGE MEDICATIONS:  He will resume all of his previous home medications. Will have him take K-Dur 20 mEq  later on today. He is given 40 mEq p.o. prior to discharge.  He will continue Nexium 40 mg a day, Imdur 30 mg a day, Xanax p.r.n., Tenormin 25 b.i.d., Plavix 75 mg a day, Altace 5 mg b.i.d. and Zocor 40 mg a day and a multivitamin daily.  ACTIVITY:  His activity is to be light over the next few days.  DIET:  Low fat.  WOUND CARE:  He is to place an ice pack as needed to the groin.  FOLLOW-UP:   We will ask him to follow up with Alvia Grove., M.D., in the office next week and he is asked to call to schedule that appointment. DD:  04/08/01 TD:  04/09/01 Job: [id number]

## 2011-02-02 NOTE — H&P (Signed)
[place]. [facility]  Patient:    [name], [name]                    MRN: [id number] Adm. Date:  [id number] Attending:  Sela Hua CC:         Dr. Yates Decamp in [place]   History and Physical  HISTORY OF PRESENT ILLNESS:  [name] is a 70 year old gentleman with no significant past medical history.  He is admitted through the emergency room with episodes of chest pain.  The patient has been in fairly good health.  He states that he started having some chest heaviness and tightness last Friday.  The pain occurred at rest, but worsened with exertion when he tried to swim.  Over the weekend, he has had a few minor episodes but has not had any significant episodes of pain or discomfort.  This morning, he had the same pain associated with bilateral arm heaviness, as well as some significant dyspnea.  The pain continued for about an hour and he called EMS.  EMS gave him two nitroglycerin, which almost completely relieved the pain.  Here in the emergency room, he was given IV nitroglycerin and IV heparin, which completely resolved the chest pain.  He is admitted for further evaluation and management.  CURRENT MEDICATIONS:  None.  ALLERGIES:  WELLBUTRIN.  PAST MEDICAL HISTORY:  None.  SOCIAL HISTORY:  The patient smokes one pack a day.  He does not drink alcohol.  He works at ______.  FAMILY HISTORY:  Positive for coronary artery disease.  REVIEW OF SYSTEMS:  Negative.  PHYSICAL EXAMINATION:  GENERAL:  He is a middle-aged gentleman in no acute distress.  VITAL SIGNS:  Heart rate is 90, blood pressure 160/90.  HEENT:  Reveals 2+ carotids but no bruits.  There is no JVD.  He has no lymphadenopathy.  LUNGS:  Clear to auscultation.  BACK:  Nontender.  HEART:  Regular rate, S1, S2.  He has no murmurs, gallops, or rubs.  His PMI is non-displaced.  ABDOMEN:  Reveals good bowel sounds and nontender.  He has  no hepatosplenomegaly.  EXTREMITIES:  He has good pulses and his calves are nontender.  He has no clubbing, cyanosis, or edema.  NEUROLOGIC:  Reveals cranial nerves 2-12 are intact, and his motor sensory function are grossly intact.  LABORATORY DATA:  His EKG reveals normal sinus rhythm.  He has no significant ST or T wave changes.  CBC is relatively normal.  His cardiac enzymes are pending.  IMPRESSION:  [name] presents with multiple risk factors for coronary artery disease, including cigarette smoking and a family history of coronary artery disease, and a male sex.  His symptoms are consistent with unstable angina.  His pain resolved with nitroglycerin.  We will admit him to the hospital and collect serial CPKs and get an EKG in the morning.  We will plan on doing a heart catheterization tomorrow morning.  We discussed the risks, benefits, and options of heart catheterization.  He understands and agrees to proceed. DD:  05/27/00 TD:  05/27/00 Job: [id number]

## 2011-02-02 NOTE — H&P (Signed)
NAME:  Brian, [name] NO.:  [id number]   MEDICAL RECORD NO.:  [id number]                   PATIENT TYPE:  EMS   LOCATION:  MAJO                                 FACILITY:  [facility]   PHYSICIAN:  Vesta Mixer, M.D.              DATE OF BIRTH:  1941-03-25   DATE OF ADMISSION:  05/24/2004  DATE OF DISCHARGE:                                HISTORY & PHYSICAL   HISTORY OF PRESENT ILLNESS:  [name] is a 70 year old white man who  is admitted to [facility] for further evaluation of  chest pain.   The patient has a history of coronary artery disease which dates back to  2001.  At that time, he underwent coronary artery bypass surgery.  In 2002,  he underwent stenting of the right coronary artery.  In 2003, he returned  with chest pain.  Cardiac catheterization was performed.  This revealed the  left main to be normal.  The LAD had moderate severe disease of between 75%  and 80% throughout its course.  There was obvious competitive flow from the  left internal mammary artery.  The circumflex was a relatively small vessel.  There were diffuse irregularities of between 50% and 60% throughout its  course.  The right coronary artery was large and dominant.  The proximal  stent was widely patent.  There were minor luminal irregularities throughout  the course of the right coronary artery.  The posterior descending artery  and the posterolateral segment were normal.  The saphenous vein graft to the  second obtuse marginal was widely patent.  The anastomosis was normal.  The  native vessel was small but otherwise unremarkable.  The saphenous vein  graft to the diagonal system was normal.  The anastomosis was normal and the  native vessel normal.  The right internal mammary artery was atretic with no  significant flow to the right coronary artery.  The left internal mammary  artery was widely patent.  There was brisk flow down the LAD.  There were  some mild-to-moderate irregularities in the distal aspect of the LAD of  approximately 20% to 30%.  These did not appear to flow-obstructive.  There  was obvious competitive flow from the native system.  Left ventricular  function was normal.  Continued medical therapy was recommended.   The patient presented to the emergency department this evening with chest  pain which began this evening.  He was preparing for bed at the time.  The  chest pain was described as a pressure in his left anterior chest.  It did  not radiate, but it was associated with an ache in both arms.  There was no  dyspnea, diaphoresis, or nausea.  There were no exacerbating or ameliorating  factors.  He took 1 nitroglycerin tablet which had some mild ameliorating  effect, but which did not relieve his chest pain.  His chest pain eventually  resolved by the time that he presented to the emergency department.  The  total duration of chest pain was approximately 3 hours.   There is no history of congestive heart failure or arrhythmia.   The patient has a history of hypertension and dyslipidemia.  There is no  history of diabetes mellitus.  He stopped smoking at the time of his bypass.  There is a family history of coronary artery disease; he has siblings who  suffered from coronary artery disease in their early 92s.   In addition to the aforementioned problems, the patient has a history of  gastroesophageal reflux.   ALLERGIES:  WELLBUTRIN.   MEDICATIONS:  Atenolol, Altace, Zetia, Nexium, and isosorbide.   OPERATIONS:  In addition to the coronary artery bypass surgery, he has  undergone left shoulder and multiple back operations.   SOCIAL HISTORY:  The patient is retired.  He lives with his wife.   REVIEW OF SYSTEMS:  Review of systems reveals no new problems related to his  head, eyes, ears, nose, mouth, throat, lungs, gastrointestinal system,  genitourinary system, or extremities.  There is no history of  neurologic or  psychiatric disorder.  There is no history of fever, chills, or weight loss.   PHYSICAL EXAMINATION:  VITAL SIGNS:  Blood pressure 176/102.  Pulse 63 and  regular.  Respirations 20.  Temperature 97.5.  GENERAL:  The patient was an older white man in no discomfort.  He was  alert, oriented, appropriate, and responsive.  HEENT:  Head, eyes, nose, and mouth were normal.  NECK:  The neck was without thyromegaly or adenopathy.  Carotid pulses were  palpable bilaterally and without bruits.  CARDIAC:  Examination revealed a normal S1 and S2.  There was no S3, S4,  murmur, rub, or click.  Cardiac rhythm was regular.  No chest wall  tenderness was noted.  LUNGS:  The lungs were clear.  ABDOMEN:  The abdomen was soft and nontender.  There was no mass,  hepatosplenomegaly, bruit, distention, rebound, guarding, or rigidity.  Bowel sounds were normal.  RECTAL AND GENITAL:  Examinations were not performed as they were not  pertinent to the reason for acute care hospitalization.  EXTREMITIES:  The extremities were without edema, deviation, or deformity.  Radial and dorsalis pedal pulses were palpable bilaterally.  NEUROLOGIC:  Brief screening neurologic survey was unremarkable.   LABORATORY AND ACCESSORY CLINICAL DATA:  The electrocardiogram revealed  nonspecific T wave flattening in the lateral leads; it was otherwise  unremarkable.   The chest radiograph, according to the radiologist, demonstrated mild left  lower lobe atelectasis or scar.   The initial set of cardiac markers revealed a CK-MB of less than 1.0,  myoglobin 58.3, and troponin less than 0.05.  Potassium was 4.0, BUN 19, and  creatinine 1.3.  White count was 6.9 with a hemoglobin of 15.3 and  hematocrit of 44.4.  The remaining studies are pending at the time of this  dictation.   IMPRESSION:  1.  Chest pain, rule out unstable angina. 2.  Coronary artery disease.  Status post coronary artery bypass surgery in       2001.  Status post right coronary artery stent in 2002.  Last cardiac      catheterization in 2003; details noted above.  3.  Hypertension.  4.  Dyslipidemia.  5.  Gastroesophageal reflux.   PLAN:  1.  Telemetry.  2.  Serial cardiac enzymes.  3.  Aspirin.  4.  IV heparin.  5.  IV nitroglycerin.  6.  Continue atenolol.  7.  Further measures per Dr. Deloris Ping. Nahser.      Brian Skye. Waldon Reining, MD                   Vesta Mixer, M.D.    MSC/MEDQ  D:  05/24/2004  T:  05/24/2004  Job:  [id number]

## 2011-02-02 NOTE — H&P (Signed)
NAME:  [name], [name] NO.:  [id number]   MEDICAL RECORD NO.:  [id number]           PATIENT TYPE:   LOCATION:                                 FACILITY:   PHYSICIAN:  Vesta Mixer, M.D.      DATE OF BIRTH:   DATE OF ADMISSION:  12/26/2005  DATE OF DISCHARGE:                                HISTORY & PHYSICAL   [name] is a middle-aged gentleman with a history of coronary artery  disease. He is status post CABG. He is admitted to the hospital for heart  catheterization after having recurring episodes of chest pain.   [name] is a middle-aged gentleman with a history of coronary artery  disease. He is status post coronary artery bypass graft several years ago.  His right internal mammary artery became atretic following his surgery and  he ultimately had PTCA and stenting of his right coronary artery. He  presented a week or so ago with 1-2 weeks of episodes of chest pressure as  well as shortness of breath. These episodes typically occurred when he was  walking. Otherwise he had done fairly well since that time. He subsequently  had an adenocine Cardiolite study which revealed attenuation of the inferior  wall. He had some reversibility in this region. He also had moderate lead of  left ventricular systolic function with an ejection fraction of 42%.   Because of these problems, he is referred for heart catheterization.   CURRENT MEDICATIONS:  1.  HCTZ 25 mg a day.  2.  Atenolol 25 mg p.o. b.i.d.  3.  Altace 5 mg p.o. b.i.d.  4.  Potassium chloride 20 mEq a day.  5.  Imdur 30 mg a day.  6.  Aspirin 325 mg a day.   ALLERGIES:  He is allergic to [facility] and has been intolerant to Zocor.   PAST MEDICAL HISTORY:  1.  Coronary artery disease - status post CABG.  2.  Hypertension.  3.  Dyslipidemia.  4.  Gastroesophageal reflux disease.   SOCIAL HISTORY:  The patient is a nonsmoker. He quit in 2002. He does not  drink alcohol.   FAMILY  HISTORY:  Noncontributory.   REVIEW OF SYSTEMS:  The patient denies any problems with any eyes, ears,  nose and throat. He denies any heat or cold intolerance or weight gain or  weight loss. He denies any easy bruising. He denies any problems with his GI  or GU systems. He denies any syncope or presyncope. He denies any PND or  orthopnea.   PHYSICAL EXAMINATION:  GENERAL:  He is a middle-aged gentleman in no acute  distress. He is alert and oriented x3 and his mood and affect are normal.  His weight is 197.  VITAL SIGNS:  His blood pressure is 126/84 with a heart rate of 64.  HEENT:  2+ carotids, he has no bruits, no JVD, no thyromegaly.  LUNGS:  Clear to auscultation.  HEART:  Regular rate, S1, S2 with no murmurs.  ABDOMEN:  Reveals good bowel sounds and is nontender.  EXTREMITIES:  He has no cyanosis,  clubbing or edema.  NEUROLOGIC:  Nonfocal.   [name] presents with persistent chest pain and shortness of breath. He  had a stress Cardiolite study which reveals attenuation in the inferior  wall. It is quite possible that his right coronary stent has restenosed. His  right internal mammary artery is known to be atretic. We scheduled him for a  heart catheterization. We have discussed the risks, benefits and options of  heart catheterization. He understands and agrees to proceed. All of his  other medical problems remain stable.           ______________________________  Vesta Mixer, M.D.     PJN/MEDQ  D:  12/18/2005  T:  12/18/2005  Job:  [id number]

## 2011-02-02 NOTE — Discharge Summary (Signed)
NAMEJORRYN, [name]             ACCOUNT NO.:  [id number]   MEDICAL RECORD NO.:  [id number]          PATIENT TYPE:  OBV   LOCATION:  6525                         FACILITY:  [facility]   PHYSICIAN:  Vesta Mixer, M.D. DATE OF BIRTH:  07/26/1941   DATE OF ADMISSION:  12/24/2005  DATE OF DISCHARGE:  12/26/2005                                 DISCHARGE SUMMARY   DISCHARGE DIAGNOSES:  1.  History of coronary artery disease.  2.  Non-cardiac chest pain.  3.  Hyperlipidemia.  4.  Hypertension.  5.  Peptic ulcer disease.   DISCHARGE MEDICATIONS:  1.  Potassium chloride 20 mEq a day.  2.  Aspirin 81 mg a day.  3.  Atenolol 25 mg p.o. b.i.d.  4.  Altace 5 mg p.o. b.i.d.  5.  Zetia 10 mg a day.  6.  HCTZ 25 mg a day.  7.  Imdur 30 mg a day.   DISPOSITION:  The patient will see Dr. Elease Hashimoto in three to four weeks.  He is  to see his medical doctor in one to two weeks.   HISTORY:  [name] is a 70 year old gentleman with a history of coronary  artery disease.  He is status post CABG.  He was admitted for episodes of  chest pain.  Please see dictated H&P for further details.   HOSPITAL COURSE:  #1 - CORONARY ARTERY DISEASE:  The patient ruled out for  myocardial infarction.  His EKG remained unchanged.  Because of his ongoing  chest pain we proceeded with heart catheterization.  He was found to have  moderate to severe native coronary artery disease.  His left anterior  descending artery had moderate diffuse irregularities throughout the course.  The left internal mammary artery was patent and there was competitive flow  from both the native LAD and the LIMA.   The first diagonal vessel was occluded.  The saphenous vein graft to the  first diagonal artery was widely patent.   The left circumflex artery is a moderate-sized vessel.  There is mild  irregularities throughout the circumflex artery.  The first obtuse marginal  artery was occluded but the saphenous vein graft to the  first obtuse  marginal was a large graft and was normal.   The right coronary artery was large and dominant.  There is a stent in the  proximal segment.  The stent was widely patent.  There is a mid 20-30% shelf-  like stenosis.  There was no obstruction to flow but there was some pooling  of blood there.  The posterior descending artery had moderate diffuse  disease and the posterolateral had mild irregularities.  The right internal  mammary artery was known to be atretic from previous heart catheterizations  and was not selectively engaged.   The left ventriculogram revealed normal left ventricular systolic function  with an ejection fraction of 50%.   The patient did quite well.  He did not have any further chest pains.  It is  assumed that his chest pain is due to a GI etiology.  He will follow up with  his  medical doctor.  All of his other cardiology problems are unchanged.           ______________________________  Vesta Mixer, M.D.     PJN/MEDQ  D:  12/26/2005  T:  12/26/2005  Job:  [id number]

## 2011-02-02 NOTE — Cardiovascular Report (Signed)
NAME:  LESLY, JOSLYN NO.:  [id number]   MEDICAL RECORD NO.:  [id number]          PATIENT TYPE:  OBV   LOCATION:  6525                         FACILITY:  [facility]   PHYSICIAN:  Vesta Mixer, M.D. DATE OF BIRTH:  [birth year]   DATE OF PROCEDURE:  12/25/2005  DATE OF DISCHARGE:                              CARDIAC CATHETERIZATION   Mr. Whyte is a 70 year old gentleman with a history of coronary artery  disease. He is status post coronary artery bypass grafting.  His left heart  catheterization was performed 1.5 years ago.  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  The native right coronary artery was  patent.  He had patent grafts at that time.   He was admitted to the hospital last night with episodes of chest  discomfort.  He ruled out for myocardial infarction.  Was scheduled for  heart catheterization for further evaluation.   The procedure was left heart catheterization with coronary angiography.  The  right femoral artery was easily cannulated using a modified Seldinger  technique.   HEMODYNAMIC RESULTS:  Left ventricular pressure is 126/15 with an aortic  pressure of 125/71.   Angiography, left main, left main is fairly normal.   Left anterior descending artery has mild luminal irregularities.  It has  moderate diffuse irregularities.  There is competitive flow from the left  internal mammary artery.  The first diagonal artery is occluded.   The left circumflex artery is a moderate size vessel.  The first obtuse  marginal artery is occluded.  The distal circumflex has minor luminal  irregularities.   The native right coronary artery is a large and dominant vessel.  The  proximal stent is widely patent.  There is a mid stenosis of approximately  30-40%.  There is a distal stenosis of 20-30%.   The posterior descending artery has moderate diffuse irregularities between  20 and 30%.  There are no flow obstructing lesions.   The posterolateral  segment artery is unremarkable.   The right internal mammary artery is known to be atretic from his previous  heart catheterization.  Was not selectively engaged.   The saphenous vein graft to the first diagonal artery is a normal graft.  Anastomosis to the vessel to the diagonal is normal.  The diagonal artery is  a fairly small but is otherwise normal.   The saphenous vein graft to the obtuse marginal artery is a normal graft  with a normal anastomosis.  The  first OM is normal.   The left internal mammary artery to LAD is a normal graft.  The anastomosis  to the LAD is normal.  The LAD has mild to moderate irregularities.  There  is competitive flow seen from the native circulation.   Left ventriculogram was performed in the 30 RAO position.  It reveals a  fairly well-preserved left ventricular systolic function.  Ejection fraction  50%.   COMPLICATIONS:  None.   CONCLUSIONS:  1.  Patent LIMA to LAD.  In addition the native LAD has only moderate      irregularities.  There is a patent saphenous vein graft to the diagonal      artery, a patent saphenous vein graft to the first obtuse marginal      artery and a patent native right coronary artery.  We will continue with      medical therapy.           ______________________________  Vesta Mixer, M.D.     PJN/MEDQ  D:  12/25/2005  T:  12/25/2005  Job:  [id number]

## 2011-04-08 ENCOUNTER — Other Ambulatory Visit: Payer: Self-pay | Admitting: Cardiovascular Disease

## 2011-04-09 ENCOUNTER — Other Ambulatory Visit: Payer: Self-pay | Admitting: *Deleted

## 2011-04-09 DIAGNOSIS — E785 Hyperlipidemia, unspecified: Secondary | ICD-10-CM

## 2011-04-09 MED ORDER — PRAVASTATIN SODIUM 40 MG PO TABS: 40.0000 mg | ORAL_TABLET | Freq: Every day | ORAL | Status: DC

## 2011-04-09 NOTE — Telephone Encounter (Signed)
Called pt and set up app for fasting labs.Alfonso Ramus RN

## 2011-05-02 ENCOUNTER — Other Ambulatory Visit: Payer: Medicare Other | Admitting: *Deleted

## 2011-05-09 ENCOUNTER — Other Ambulatory Visit (INDEPENDENT_AMBULATORY_CARE_PROVIDER_SITE_OTHER): Payer: Medicare Other | Admitting: *Deleted

## 2011-05-09 ENCOUNTER — Encounter: Payer: Self-pay | Admitting: Cardiovascular Disease

## 2011-05-09 DIAGNOSIS — E785 Hyperlipidemia, unspecified: Secondary | ICD-10-CM

## 2011-05-09 LAB — LIPID PANEL
Cholesterol: 151 mg/dL (ref 0–200)
HDL: 42.8 mg/dL (ref >39.00)
LDL Cholesterol: 79 mg/dL (ref 0–99)
Triglycerides: 148 mg/dL (ref 0.0–149.0)
VLDL: 29.6 mg/dL (ref 0.0–40.0)

## 2011-05-09 LAB — BASIC METABOLIC PANEL
Chloride: 107 mEq/L (ref 96–112)
Creatinine, Ser: 1 mg/dL (ref 0.4–1.5)
Sodium: 142 mEq/L (ref 135–145)

## 2011-05-09 LAB — HEPATIC FUNCTION PANEL
Albumin: 4.1 g/dL (ref 3.5–5.2)
Alkaline Phosphatase: 41 U/L (ref 39–117)
Total Protein: 6.8 g/dL (ref 6.0–8.3)

## 2011-05-23 ENCOUNTER — Ambulatory Visit (INDEPENDENT_AMBULATORY_CARE_PROVIDER_SITE_OTHER): Payer: Medicare Other | Admitting: Cardiovascular Disease

## 2011-05-23 ENCOUNTER — Encounter: Payer: Self-pay | Admitting: Cardiovascular Disease

## 2011-05-23 VITALS — BP 130/84 | HR 50 | Ht 67.0 in | Wt 189.0 lb

## 2011-05-23 DIAGNOSIS — I10 Essential (primary) hypertension: Secondary | ICD-10-CM

## 2011-05-23 DIAGNOSIS — E785 Hyperlipidemia, unspecified: Secondary | ICD-10-CM

## 2011-05-23 DIAGNOSIS — I2510 Atherosclerotic heart disease of native coronary artery without angina pectoris: Secondary | ICD-10-CM

## 2011-05-23 NOTE — Progress Notes (Signed)
[name] Date of Birth  [birth year] [facility] / [facility] [address]     [address] [place], [place]  [address] [phone]  Fax  [phone]  History of Present Illness:  70 year old gentleman with a history of coronary artery disease.  He does not get a lot of exercise because of his orthopedic problems. He has chronic shortness but that has not had any episodes of chest discomfort.  Current Outpatient Prescriptions on File Prior to Visit  Medication Sig Dispense Refill  . aspirin 325 MG tablet Take 325 mg by mouth daily.        Marland Kitchen atenolol (TENORMIN) 25 MG tablet Take 25 mg by mouth 2 (two) times daily.        . hydrochlorothiazide 25 MG tablet TAKE ONE TABLET BY MOUTH EVERY DAY  90 tablet  3  . KLOR-CON M20 20 MEQ tablet TAKE ONE TABLET BY MOUTH EVERY DAY  90 each  3  . lisinopril (PRINIVIL,ZESTRIL) 10 MG tablet Take 10 mg by mouth daily.        . Multiple Vitamin (MULTIVITAMIN) tablet Take 1 tablet by mouth daily.        . nitroGLYCERIN (NITROSTAT) 0.4 MG SL tablet Place 0.4 mg under the tongue every 5 (five) minutes as needed.        . pravastatin (PRAVACHOL) 40 MG tablet Take 1 tablet (40 mg total) by mouth daily.  180 tablet  1  . Tolterodine Tartrate (DETROL LA PO) Take by mouth daily.          Allergies  Allergen Reactions  . Ciprofloxacin   . Diclofenac   . Oxybutynin Chloride   . Wellbutrin (Bupropion Hcl)   . Zocor (Simvastatin)     Past Medical History  Diagnosis Date  . Coronary artery disease   . Hyperlipidemia   . Hypertension   . Bilateral calf pain   . SOB (shortness of breath)   . GERD (gastroesophageal reflux disease)   . Anxiety   . OA (osteoarthritis)     Past Surgical History  Procedure Date  . Cardiac catheterization 05/06/2009    EF 40-45%  . Coronary artery bypass graft   . Cardiovascular stress test 12/11/2005    EF 42%    History  Smoking status  . Former Smoker  . Quit date: 09/17/1998    Smokeless tobacco  . Not on file    History  Alcohol Use No    No family history on file.  Reviw of Systems:  Reviewed in the HPI.  All other systems are negative.  Physical Exam: BP 130/84  Pulse 50  Ht 5\' 7"  (1.702 m)  Wt 189 lb (85.73 kg)  BMI 29.60 kg/m2 The patient is alert and oriented x 3.  The mood and affect are normal.   Skin: warm and dry.  Color is normal.    HEENT:   the sclera are nonicteric.  The mucous membranes are moist.  The carotids are 2+ without bruits.  There is no thyromegaly.  There is no JVD.    Lungs: clear.  The chest wall is non tender.    Heart: regular rate with a normal S1 and S2.  There are no murmurs, gallops, or rubs. The PMI is not displaced.     Abdomen: good bowel sounds.  There is no guarding or rebound.  There is no hepatosplenomegaly or tenderness.  There are no masses.   Extremities:  no clubbing, cyanosis, or  edema.  The legs are without rashes.  The distal pulses are intact.   Neuro:  Cranial nerves II - XII are intact.  Motor and sensory functions are intact.    The gait is normal.  ECG:  Assessment / Plan:

## 2011-05-23 NOTE — Assessment & Plan Note (Addendum)
Stable.  He's not had any episodes of angina. Shortness of breath is chronic and does not sound like it's any different from his previous exams.

## 2011-05-23 NOTE — Assessment & Plan Note (Signed)
He will continue the same medications. He wants to start taking some coconut oil extract medicine. We will check his lipids again in 6 months.

## 2011-05-23 NOTE — Assessment & Plan Note (Signed)
Blood pressure is well controlled. Will continue the same medications.

## 2011-06-28 ENCOUNTER — Other Ambulatory Visit: Payer: Self-pay | Admitting: Cardiovascular Disease

## 2011-06-28 ENCOUNTER — Ambulatory Visit: Payer: PRIVATE HEALTH INSURANCE | Admitting: Internal Medicine

## 2011-06-29 ENCOUNTER — Other Ambulatory Visit: Payer: Self-pay | Admitting: *Deleted

## 2011-06-29 LAB — I-STAT 8, (EC8 V) (CONVERTED LAB)
Acid-base deficit: 1
Chloride: 108
HCT: 45
Hemoglobin: 15.3
Operator id: 294521
Potassium: 3.8
Sodium: 139
pH, Ven: 7.446 — ABNORMAL HIGH

## 2011-06-29 LAB — CK TOTAL AND CKMB (NOT AT ARMC)
Relative Index: 1
Relative Index: INVALID
Total CK: 78

## 2011-06-29 LAB — LIPID PANEL
Triglycerides: 188 — ABNORMAL HIGH
VLDL: 38

## 2011-06-29 LAB — COMPREHENSIVE METABOLIC PANEL
Albumin: 3.2 — ABNORMAL LOW
Alkaline Phosphatase: 38 — ABNORMAL LOW
BUN: 18
CO2: 26
Chloride: 104
Creatinine, Ser: 1.11
GFR calc non Af Amer: >60
Glucose, Bld: 83
Potassium: 3.7
Total Bilirubin: 0.5

## 2011-06-29 LAB — POCT I-STAT CREATININE: Creatinine, Ser: 1.3

## 2011-06-29 LAB — POCT CARDIAC MARKERS: Troponin i, poc: <0.05

## 2011-06-29 LAB — CBC
HCT: 42.7
HCT: 43.5
Hemoglobin: 15.2
MCHC: 34.8
MCHC: 35.6
MCV: 91.2
RBC: 4.71
RBC: 4.78

## 2011-06-29 LAB — TROPONIN I
Troponin I: 0.01
Troponin I: 0.03

## 2011-06-29 LAB — D-DIMER, QUANTITATIVE: D-Dimer, Quant: 0.27

## 2011-06-29 LAB — HEMOGLOBIN A1C: Mean Plasma Glucose: 119

## 2011-06-29 MED ORDER — LISINOPRIL 10 MG PO TABS: 10.0000 mg | ORAL_TABLET | Freq: Every day | ORAL | Status: DC

## 2011-06-29 NOTE — Telephone Encounter (Signed)
Fax Received. Refill Completed. [name] (M.A)  

## 2011-10-07 ENCOUNTER — Other Ambulatory Visit: Payer: Self-pay | Admitting: Cardiovascular Disease

## 2011-11-19 ENCOUNTER — Ambulatory Visit (INDEPENDENT_AMBULATORY_CARE_PROVIDER_SITE_OTHER): Payer: Medicare Other | Admitting: Cardiovascular Disease

## 2011-11-19 ENCOUNTER — Encounter: Payer: Self-pay | Admitting: Cardiovascular Disease

## 2011-11-19 DIAGNOSIS — I10 Essential (primary) hypertension: Secondary | ICD-10-CM

## 2011-11-19 DIAGNOSIS — E785 Hyperlipidemia, unspecified: Secondary | ICD-10-CM

## 2011-11-19 DIAGNOSIS — I2510 Atherosclerotic heart disease of native coronary artery without angina pectoris: Secondary | ICD-10-CM

## 2011-11-19 LAB — BASIC METABOLIC PANEL
BUN: 12 mg/dL (ref 6–23)
CO2: 27 mEq/L (ref 19–32)
Glucose, Bld: 82 mg/dL (ref 70–99)
Potassium: 4 mEq/L (ref 3.5–5.1)
Sodium: 140 mEq/L (ref 135–145)

## 2011-11-19 LAB — LIPID PANEL
HDL: 41.3 mg/dL (ref >39.00)
Total CHOL/HDL Ratio: 4
VLDL: 54.4 mg/dL — ABNORMAL HIGH (ref 0.0–40.0)

## 2011-11-19 LAB — HEPATIC FUNCTION PANEL
Bilirubin, Direct: 0 mg/dL (ref 0.0–0.3)
Total Bilirubin: 0.5 mg/dL (ref 0.3–1.2)

## 2011-11-19 NOTE — Assessment & Plan Note (Signed)
Stable

## 2011-11-19 NOTE — Assessment & Plan Note (Signed)
[name] is doing very well.  We'll check fasting labs on him today and again when I see him in 6 months.

## 2011-11-19 NOTE — Patient Instructions (Signed)
Your physician wants you to follow-up in: 6 MONTHS  You will receive a reminder letter in the mail two months in advance. If you don't receive a letter, please call our office to schedule the follow-up appointment.  Your physician recommends that you return for a FASTING lipid profile: TODAY AND IN 6 MONTHS  

## 2011-11-19 NOTE — Progress Notes (Signed)
[name] Date of Birth  05-25-1941 [facility] / [facility] [address]     [address] [place], [place]  [address] [phone]  Fax  [phone]   Problem list: 1. Coronary artery disease-status post CABG 2. Hypertension 3. Hyperlipidemia  History of Present Illness:  71 year old gentleman with a history of coronary artery disease.  He does not get a lot of exercise because of his orthopedic problems. He has chronic shortness but that has not had any episodes of chest discomfort.  Current Outpatient Prescriptions on File Prior to Visit  Medication Sig Dispense Refill  . aspirin 325 MG tablet Take 325 mg by mouth daily.        Marland Kitchen atenolol (TENORMIN) 25 MG tablet TAKE ONE TABLET BY MOUTH TWICE DAILY  60 tablet  6  . hydrochlorothiazide 25 MG tablet TAKE ONE TABLET BY MOUTH EVERY DAY  90 tablet  3  . KLOR-CON M20 20 MEQ tablet TAKE ONE TABLET BY MOUTH EVERY DAY  90 each  3  . lisinopril (PRINIVIL,ZESTRIL) 10 MG tablet TAKE ONE TABLET BY MOUTH EVERY DAY  90 tablet  3  . Multiple Vitamin (MULTIVITAMIN) tablet Take 1 tablet by mouth daily.        . nitroGLYCERIN (NITROSTAT) 0.4 MG SL tablet Place 0.4 mg under the tongue every 5 (five) minutes as needed.        . pravastatin (PRAVACHOL) 40 MG tablet Take 1 tablet (40 mg total) by mouth daily.  180 tablet  1    Allergies  Allergen Reactions  . Ciprofloxacin   . Diclofenac   . Oxybutynin Chloride   . Wellbutrin (Bupropion Hcl)   . Zocor (Simvastatin)     Past Medical History  Diagnosis Date  . Coronary artery disease   . Hyperlipidemia   . Hypertension   . Bilateral calf pain   . SOB (shortness of breath)   . GERD (gastroesophageal reflux disease)   . Anxiety   . OA (osteoarthritis)     Past Surgical History  Procedure Date  . Cardiac catheterization 05/06/2009    EF 40-45%  . Coronary artery bypass graft   . Cardiovascular stress test 12/11/2005    EF 42%    History  Smoking  status  . Former Smoker  . Quit date: 09/17/1998  Smokeless tobacco  . Not on file    History  Alcohol Use No    No family history on file.  Reviw of Systems:  Reviewed in the HPI.  All other systems are negative.  Physical Exam: BP 105/60  Pulse 60  Ht 5\' 7"  (1.702 m)  Wt 188 lb 6.4 oz (85.458 kg)  BMI 29.51 kg/m2 The patient is alert and oriented x 3.  The mood and affect are normal.   Skin: warm and dry.  Color is normal.    HEENT:   the sclera are nonicteric.  The mucous membranes are moist.  The carotids are 2+ without bruits.  There is no thyromegaly.  There is no JVD.    Lungs: clear.  The chest wall is non tender.    Heart: regular rate with a normal S1 and S2.  There are no murmurs, gallops, or rubs. The PMI is not displaced.     Abdomen: good bowel sounds.  There is no guarding or rebound.  There is no hepatosplenomegaly or tenderness.  There are no masses.   Extremities:  no clubbing, cyanosis, or edema.  The legs are  without rashes.  The distal pulses are intact.   Neuro:  Cranial nerves II - XII are intact.  Motor and sensory functions are intact.    The gait is normal.  ECG: Normal sinus rhythm. He has nonspecific ST and T-wave abnormalities.  There is no change from his previous tracing.  Assessment / Plan:

## 2011-12-28 ENCOUNTER — Other Ambulatory Visit: Payer: Self-pay | Admitting: Cardiovascular Disease

## 2011-12-28 NOTE — Telephone Encounter (Signed)
Fax Received. Refill Completed. Lin Glazier Chowoe (R.M.A)   

## 2012-02-27 ENCOUNTER — Other Ambulatory Visit: Payer: Self-pay | Admitting: Cardiovascular Disease

## 2012-02-28 NOTE — Telephone Encounter (Signed)
Fax Received. Refill Completed. [name] (R.M.A)   

## 2012-05-15 ENCOUNTER — Encounter: Payer: Self-pay | Admitting: Cardiovascular Disease

## 2012-05-15 ENCOUNTER — Ambulatory Visit (INDEPENDENT_AMBULATORY_CARE_PROVIDER_SITE_OTHER): Payer: Medicare Other | Admitting: Cardiovascular Disease

## 2012-05-15 VITALS — BP 128/72 | HR 62 | Ht 67.0 in | Wt 183.0 lb

## 2012-05-15 DIAGNOSIS — E785 Hyperlipidemia, unspecified: Secondary | ICD-10-CM

## 2012-05-15 DIAGNOSIS — I2510 Atherosclerotic heart disease of native coronary artery without angina pectoris: Secondary | ICD-10-CM

## 2012-05-15 DIAGNOSIS — I10 Essential (primary) hypertension: Secondary | ICD-10-CM

## 2012-05-15 DIAGNOSIS — Z79899 Other long term (current) drug therapy: Secondary | ICD-10-CM

## 2012-05-15 NOTE — Progress Notes (Signed)
[name] Date of Birth  09/18/1940 [facility] / [facility] [address]     [address] [place], [place]  [address] [phone]  Fax  [phone]   Problem list: 1. Coronary artery disease-status post CABG 2. Hypertension 3. Hyperlipidemia  History of Present Illness:  71 year old gentleman with a history of coronary artery disease.  He does not get a lot of exercise because of his orthopedic problems. He has chronic shortness but that has not had any episodes of chest discomfort.  He admits that he needs to get out and do more walking.    Current Outpatient Prescriptions on File Prior to Visit  Medication Sig Dispense Refill  . aspirin 325 MG tablet Take 325 mg by mouth daily.        Marland Kitchen atenolol (TENORMIN) 25 MG tablet TAKE ONE TABLET BY MOUTH TWICE DAILY  60 tablet  6  . hydrochlorothiazide (HYDRODIURIL) 25 MG tablet TAKE ONE TABLET BY MOUTH EVERY DAY  90 tablet  3  . KLOR-CON M20 20 MEQ tablet TAKE ONE TABLET BY MOUTH EVERY DAY  90 each  3  . lisinopril (PRINIVIL,ZESTRIL) 10 MG tablet TAKE ONE TABLET BY MOUTH EVERY DAY  90 tablet  3  . Multiple Vitamin (MULTIVITAMIN) tablet Take 1 tablet by mouth daily.        . pravastatin (PRAVACHOL) 40 MG tablet TAKE TWO TABLETS BY MOUTH EVERY DAY  180 tablet  3    Allergies  Allergen Reactions  . Ciprofloxacin   . Diclofenac   . Oxybutynin Chloride   . Wellbutrin (Bupropion Hcl)   . Zocor (Simvastatin)     Past Medical History  Diagnosis Date  . Coronary artery disease   . Hyperlipidemia   . Hypertension   . Bilateral calf pain   . SOB (shortness of breath)   . GERD (gastroesophageal reflux disease)   . Anxiety   . OA (osteoarthritis)     Past Surgical History  Procedure Date  . Cardiac catheterization 05/06/2009    EF 40-45%  . Coronary artery bypass graft   . Cardiovascular stress test 12/11/2005    EF 42%    History  Smoking status  . Former Smoker  . Quit date: 09/17/1998    Smokeless tobacco  . Not on file    History  Alcohol Use No    Family History  Problem Relation Age of Onset  . Heart disease Father     Reviw of Systems:  Reviewed in the HPI.  All other systems are negative.  Physical Exam: BP 128/72  Pulse 62  Ht 5\' 7"  (1.702 m)  Wt 183 lb (83.008 kg)  BMI 28.66 kg/m2 The patient is alert and oriented x 3.  The mood and affect are normal.   Skin: warm and dry.  Color is normal.    HEENT:   the sclera are nonicteric.  The mucous membranes are moist.  The carotids are 2+ without bruits.  There is no thyromegaly.  There is no JVD.    Lungs: clear.  The chest wall is non tender.    Heart: regular rate with a normal S1 and S2.  There are no murmurs, gallops, or rubs. The PMI is not displaced.     Abdomen: good bowel sounds.  There is no guarding or rebound.  There is no hepatosplenomegaly or tenderness.  There are no masses.   Extremities:  no clubbing, cyanosis, or edema.  The legs are without rashes.  The distal pulses are intact.   Neuro:  Cranial nerves II - XII are intact.  Motor and sensory functions are intact.    The gait is normal.  ECG: May 15, 2012 - NSR at 64, NS ST abnormality  Assessment / Plan:

## 2012-05-15 NOTE — Patient Instructions (Addendum)
Follow up with Dr. Elease Hashimoto in 6 months. Follow up in 6 months with Liver/lipid/bmet & EKG at the time of visit.  Need to have a Lipid/Liver & BMET today.  Need to cut the aspirin down to 81 mg take one tablet daily.

## 2012-05-15 NOTE — Assessment & Plan Note (Signed)
His lipid levels are fairly stable. We will check his lipids again next office visit.

## 2012-05-15 NOTE — Assessment & Plan Note (Signed)
Ron is doing well from a cardiac standpoint. He's not had any episodes of chest pain or shortness breath but is able to do all of his normal activities without any significant problems. He needs to get out a little bit more exercise. He's bothered by some orthopedic problems.  We'll draw a lipid profile, basic metabolic profile, and liver profile today. I've encouraged him to walk on a regular basis. I'll see him again in 6 months. We will continue with the same medications except for  his aspirin which we will decrease to 81 mg a day.

## 2012-05-15 NOTE — Assessment & Plan Note (Signed)
His blood pressure remained stable. Continue with same medications.

## 2012-05-16 LAB — HEPATIC FUNCTION PANEL
AST: 24 IU/L (ref 0–40)
Alkaline Phosphatase: 51 IU/L (ref 44–103)
Bilirubin, Direct: 0.15 mg/dL (ref 0.00–0.40)
Total Bilirubin: 0.5 mg/dL (ref 0.0–1.2)

## 2012-05-16 LAB — LIPID PANEL
Chol/HDL Ratio: 3 ratio units (ref 0.0–5.0)
Cholesterol, Total: 144 mg/dL (ref 100–199)
Triglycerides: 99 mg/dL (ref 0–149)

## 2012-05-17 LAB — BASIC METABOLIC PANEL
Chloride: 102 mmol/L (ref 97–108)
Potassium: 4.1 mmol/L (ref 3.5–5.2)
Sodium: 141 mmol/L (ref 134–144)

## 2012-05-22 ENCOUNTER — Ambulatory Visit: Payer: PRIVATE HEALTH INSURANCE | Admitting: Cardiovascular Disease

## 2012-05-30 ENCOUNTER — Ambulatory Visit: Payer: Self-pay | Admitting: Unknown Physician Specialty

## 2012-06-03 ENCOUNTER — Ambulatory Visit: Payer: PRIVATE HEALTH INSURANCE | Admitting: Cardiovascular Disease

## 2012-07-06 ENCOUNTER — Other Ambulatory Visit: Payer: Self-pay | Admitting: Cardiovascular Disease

## 2012-07-07 NOTE — Telephone Encounter (Signed)
Fax Received. Refill Completed. Cari Burgo Chowoe (R.M.A)   

## 2012-09-03 ENCOUNTER — Other Ambulatory Visit: Payer: Self-pay | Admitting: Cardiovascular Disease

## 2012-09-03 MED ORDER — LISINOPRIL 10 MG PO TABS: 10.0000 mg | ORAL_TABLET | Freq: Every day | ORAL | Status: DC

## 2012-09-03 NOTE — Telephone Encounter (Signed)
Refilled Lisinopril. 

## 2012-11-06 ENCOUNTER — Other Ambulatory Visit: Payer: Self-pay | Admitting: *Deleted

## 2012-11-06 MED ORDER — HYDROCHLOROTHIAZIDE 25 MG PO TABS: 25.0000 mg | ORAL_TABLET | Freq: Every day | ORAL | Status: DC

## 2012-11-06 NOTE — Telephone Encounter (Signed)
Fax Received. Refill Completed. [name] (R.M.A)   

## 2012-11-19 ENCOUNTER — Ambulatory Visit: Payer: Medicare Other | Admitting: Cardiovascular Disease

## 2012-11-19 ENCOUNTER — Encounter: Payer: Self-pay | Admitting: *Deleted

## 2013-01-20 ENCOUNTER — Emergency Department: Payer: Self-pay | Admitting: Emergency Medicine

## 2013-01-20 ENCOUNTER — Other Ambulatory Visit: Payer: Self-pay | Admitting: *Deleted

## 2013-01-20 MED ORDER — PRAVASTATIN SODIUM 40 MG PO TABS: 40.0000 mg | ORAL_TABLET | Freq: Two times a day (BID) | ORAL | Status: AC

## 2013-01-20 NOTE — Telephone Encounter (Signed)
According to notes on file, pt is taking 80 mg of her Pravastatin. Fax Received. Refill Completed. Selby Slovacek Chowoe (R.M.A)

## 2013-03-10 ENCOUNTER — Other Ambulatory Visit: Payer: Self-pay | Admitting: *Deleted

## 2013-03-10 MED ORDER — POTASSIUM CHLORIDE CRYS ER 20 MEQ PO TBCR: 20.0000 meq | EXTENDED_RELEASE_TABLET | Freq: Every day | ORAL | Status: DC

## 2013-03-10 NOTE — Telephone Encounter (Signed)
NEED APPOINTMENT Fax Received. Refill Completed. Chanda Laperle Chowoe (R.M.A)   

## 2013-05-11 ENCOUNTER — Other Ambulatory Visit: Payer: Self-pay | Admitting: *Deleted

## 2013-05-11 MED ORDER — LISINOPRIL 10 MG PO TABS: 10.0000 mg | ORAL_TABLET | Freq: Every day | ORAL | Status: AC

## 2013-05-11 NOTE — Telephone Encounter (Signed)
Refilled Lisinopril sent to [facility]. Pt is overdue for 6 month f/u scheduled future appoint. 06/23/13 with Dr. Elease Hashimoto.

## 2013-05-13 ENCOUNTER — Other Ambulatory Visit: Payer: Self-pay

## 2013-05-13 MED ORDER — POTASSIUM CHLORIDE CRYS ER 20 MEQ PO TBCR: 20.0000 meq | EXTENDED_RELEASE_TABLET | Freq: Every day | ORAL | Status: AC

## 2013-06-16 ENCOUNTER — Emergency Department: Payer: Self-pay | Admitting: Emergency Medicine

## 2013-06-16 LAB — COMPREHENSIVE METABOLIC PANEL
Albumin: 3.4 g/dL (ref 3.4–5.0)
Anion Gap: 6 — ABNORMAL LOW (ref 7–16)
BUN: 18 mg/dL (ref 7–18)
Bilirubin,Total: 0.5 mg/dL (ref 0.2–1.0)
Calcium, Total: 8.4 mg/dL — ABNORMAL LOW (ref 8.5–10.1)
Chloride: 110 mmol/L — ABNORMAL HIGH (ref 98–107)
Creatinine: 1.13 mg/dL (ref 0.60–1.30)
EGFR (African American): >60
Potassium: 3.2 mmol/L — ABNORMAL LOW (ref 3.5–5.1)
SGOT(AST): 22 U/L (ref 15–37)
Sodium: 142 mmol/L (ref 136–145)
Total Protein: 6.4 g/dL (ref 6.4–8.2)

## 2013-06-16 LAB — CBC
HGB: 14.6 g/dL (ref 13.0–18.0)
MCH: 32 pg (ref 26.0–34.0)
MCHC: 35 g/dL (ref 32.0–36.0)
MCV: 91 fL (ref 80–100)
Platelet: 159 10*3/uL (ref 150–440)
RBC: 4.57 10*6/uL (ref 4.40–5.90)
RDW: 14 % (ref 11.5–14.5)
WBC: 7 10*3/uL (ref 3.8–10.6)

## 2013-06-16 LAB — SALICYLATE LEVEL: Salicylates, Serum: <1.7 mg/dL

## 2013-06-16 LAB — DRUG SCREEN, URINE
Barbiturates, Ur Screen: NEGATIVE (ref ?–200)
Benzodiazepine, Ur Scrn: NEGATIVE (ref ?–200)
Cannabinoid 50 Ng, Ur ~~LOC~~: NEGATIVE (ref ?–50)
Cocaine Metabolite,Ur ~~LOC~~: NEGATIVE (ref ?–300)
Methadone, Ur Screen: NEGATIVE (ref ?–300)

## 2013-06-16 LAB — ETHANOL: Ethanol %: <0.003 % (ref 0.000–0.080)

## 2013-06-23 ENCOUNTER — Ambulatory Visit: Payer: Medicare Other | Admitting: Cardiovascular Disease

## 2013-09-23 ENCOUNTER — Emergency Department: Payer: Self-pay | Admitting: Internal Medicine

## 2013-09-23 LAB — COMPREHENSIVE METABOLIC PANEL
ALBUMIN: 3.6 g/dL (ref 3.4–5.0)
ALT: 25 U/L (ref 12–78)
ANION GAP: 8 (ref 7–16)
Alkaline Phosphatase: 57 U/L
BUN: 28 mg/dL — AB (ref 7–18)
Bilirubin,Total: 0.8 mg/dL (ref 0.2–1.0)
Calcium, Total: 8.7 mg/dL (ref 8.5–10.1)
Chloride: 104 mmol/L (ref 98–107)
Co2: 26 mmol/L (ref 21–32)
Creatinine: 1.57 mg/dL — ABNORMAL HIGH (ref 0.60–1.30)
GFR CALC AF AMER: 51 — AB
GFR CALC NON AF AMER: 44 — AB
Glucose: 130 mg/dL — ABNORMAL HIGH (ref 65–99)
Osmolality: 283 (ref 275–301)
Potassium: 4.2 mmol/L (ref 3.5–5.1)
SGOT(AST): 29 U/L (ref 15–37)
Sodium: 138 mmol/L (ref 136–145)
Total Protein: 7.4 g/dL (ref 6.4–8.2)

## 2013-09-23 LAB — PROTIME-INR
INR: 1.1
PROTHROMBIN TIME: 14.1 s (ref 11.5–14.7)

## 2013-09-23 LAB — LIPASE, BLOOD: Lipase: 151 U/L (ref 73–393)

## 2013-09-23 LAB — URINALYSIS, COMPLETE
Bacteria: NONE SEEN
Bilirubin,UR: NEGATIVE
Blood: NEGATIVE
Glucose,UR: NEGATIVE mg/dL (ref 0–75)
LEUKOCYTE ESTERASE: NEGATIVE
Nitrite: NEGATIVE
Ph: 5 (ref 4.5–8.0)
RBC,UR: 1 /HPF (ref 0–5)
SQUAMOUS EPITHELIAL: NONE SEEN
Specific Gravity: 1.026 (ref 1.003–1.030)
WBC UR: 1 /HPF (ref 0–5)

## 2013-09-23 LAB — CBC
HCT: 44.3 % (ref 40.0–52.0)
HGB: 15.3 g/dL (ref 13.0–18.0)
MCH: 31.4 pg (ref 26.0–34.0)
MCHC: 34.5 g/dL (ref 32.0–36.0)
MCV: 91 fL (ref 80–100)
PLATELETS: 132 10*3/uL — AB (ref 150–440)
RBC: 4.86 10*6/uL (ref 4.40–5.90)
RDW: 14.4 % (ref 11.5–14.5)
WBC: 8.4 10*3/uL (ref 3.8–10.6)

## 2013-09-23 LAB — CK TOTAL AND CKMB (NOT AT ARMC)
CK, TOTAL: 86 U/L (ref 35–232)
CK-MB: 0.6 ng/mL (ref 0.5–3.6)

## 2013-09-23 LAB — TROPONIN I

## 2013-09-25 ENCOUNTER — Emergency Department: Payer: Self-pay | Admitting: Emergency Medicine

## 2013-09-25 LAB — CBC
HCT: 41 % (ref 40.0–52.0)
HGB: 14.1 g/dL (ref 13.0–18.0)
MCH: 31 pg (ref 26.0–34.0)
MCHC: 34.5 g/dL (ref 32.0–36.0)
MCV: 90 fL (ref 80–100)
Platelet: 139 10*3/uL — ABNORMAL LOW (ref 150–440)
RBC: 4.56 10*6/uL (ref 4.40–5.90)
RDW: 14.2 % (ref 11.5–14.5)
WBC: 9.2 10*3/uL (ref 3.8–10.6)

## 2013-09-25 LAB — CK TOTAL AND CKMB (NOT AT ARMC)
CK, TOTAL: 94 U/L (ref 35–232)
CK-MB: 1.5 ng/mL (ref 0.5–3.6)

## 2013-09-25 LAB — COMPREHENSIVE METABOLIC PANEL
ALK PHOS: 69 U/L
AST: 36 U/L (ref 15–37)
Albumin: 3.4 g/dL (ref 3.4–5.0)
Anion Gap: 9 (ref 7–16)
BUN: 20 mg/dL — ABNORMAL HIGH (ref 7–18)
Bilirubin,Total: 0.7 mg/dL (ref 0.2–1.0)
Calcium, Total: 8.2 mg/dL — ABNORMAL LOW (ref 8.5–10.1)
Chloride: 106 mmol/L (ref 98–107)
Co2: 22 mmol/L (ref 21–32)
Creatinine: 1.16 mg/dL (ref 0.60–1.30)
EGFR (African American): >60
EGFR (Non-African Amer.): >60
Glucose: 88 mg/dL (ref 65–99)
OSMOLALITY: 276 (ref 275–301)
POTASSIUM: 3.1 mmol/L — AB (ref 3.5–5.1)
SGPT (ALT): 38 U/L (ref 12–78)
Sodium: 137 mmol/L (ref 136–145)
Total Protein: 6.8 g/dL (ref 6.4–8.2)

## 2013-09-25 LAB — URINALYSIS, COMPLETE
BILIRUBIN, UR: NEGATIVE
GLUCOSE, UR: NEGATIVE mg/dL (ref 0–75)
Ketone: NEGATIVE
Leukocyte Esterase: NEGATIVE
Nitrite: NEGATIVE
Ph: 5 (ref 4.5–8.0)
Protein: NEGATIVE
SPECIFIC GRAVITY: 1.027 (ref 1.003–1.030)
Squamous Epithelial: NONE SEEN
WBC UR: 1 /HPF (ref 0–5)

## 2013-09-25 LAB — TROPONIN I: Troponin-I: <0.02 ng/mL

## 2013-09-25 LAB — PROTIME-INR
INR: 1
PROTHROMBIN TIME: 12.7 s (ref 11.5–14.7)

## 2014-01-05 ENCOUNTER — Emergency Department: Payer: Self-pay | Admitting: Emergency Medicine

## 2014-01-06 LAB — DRUG SCREEN, URINE

## 2014-01-06 LAB — CBC
HCT: 40.6 % (ref 40.0–52.0)
HGB: 13.6 g/dL (ref 13.0–18.0)
MCH: 31.1 pg (ref 26.0–34.0)
MCHC: 33.6 g/dL (ref 32.0–36.0)
MCV: 93 fL (ref 80–100)
Platelet: 126 10*3/uL — ABNORMAL LOW (ref 150–440)
RBC: 4.39 10*6/uL — AB (ref 4.40–5.90)
RDW: 14.6 % — ABNORMAL HIGH (ref 11.5–14.5)
WBC: 5.6 10*3/uL (ref 3.8–10.6)

## 2014-01-06 LAB — SALICYLATE LEVEL: Salicylates, Serum: <1.7 mg/dL

## 2014-01-06 LAB — COMPREHENSIVE METABOLIC PANEL
Albumin: 3 g/dL — ABNORMAL LOW (ref 3.4–5.0)
Alkaline Phosphatase: 52 U/L
Anion Gap: 6 — ABNORMAL LOW (ref 7–16)
BUN: 13 mg/dL (ref 7–18)
Bilirubin,Total: 0.4 mg/dL (ref 0.2–1.0)
CALCIUM: 8.3 mg/dL — AB (ref 8.5–10.1)
CO2: 30 mmol/L (ref 21–32)
Chloride: 106 mmol/L (ref 98–107)
Creatinine: 1.12 mg/dL (ref 0.60–1.30)
EGFR (African American): >60
EGFR (Non-African Amer.): >60
Glucose: 84 mg/dL (ref 65–99)
Osmolality: 282 (ref 275–301)
POTASSIUM: 3.7 mmol/L (ref 3.5–5.1)
SGOT(AST): 18 U/L (ref 15–37)
SGPT (ALT): 20 U/L (ref 12–78)
Sodium: 142 mmol/L (ref 136–145)
Total Protein: 6.3 g/dL — ABNORMAL LOW (ref 6.4–8.2)

## 2014-01-06 LAB — URINALYSIS, COMPLETE
BACTERIA: NONE SEEN
Bilirubin,UR: NEGATIVE
Blood: NEGATIVE
GLUCOSE, UR: NEGATIVE mg/dL (ref 0–75)
KETONE: NEGATIVE
Leukocyte Esterase: NEGATIVE
Nitrite: NEGATIVE
Ph: 7 (ref 4.5–8.0)
Protein: NEGATIVE
RBC,UR: <1 /HPF (ref 0–5)
Specific Gravity: 1.016 (ref 1.003–1.030)
Squamous Epithelial: NONE SEEN
WBC UR: NONE SEEN /HPF (ref 0–5)

## 2014-01-06 LAB — ETHANOL
Ethanol %: <0.003 % (ref 0.000–0.080)
Ethanol: <3 mg/dL

## 2014-01-06 LAB — TROPONIN I: Troponin-I: <0.02 ng/mL

## 2014-01-06 LAB — ACETAMINOPHEN LEVEL

## 2014-01-06 LAB — TSH: THYROID STIMULATING HORM: 2.03 u[IU]/mL

## 2014-02-09 ENCOUNTER — Emergency Department: Payer: Self-pay | Admitting: Emergency Medicine

## 2014-02-09 LAB — COMPREHENSIVE METABOLIC PANEL
ALBUMIN: 3 g/dL — AB (ref 3.4–5.0)
ALT: 15 U/L (ref 12–78)
AST: 22 U/L (ref 15–37)
Alkaline Phosphatase: 51 U/L
Anion Gap: 4 — ABNORMAL LOW (ref 7–16)
BILIRUBIN TOTAL: 0.4 mg/dL (ref 0.2–1.0)
BUN: 14 mg/dL (ref 7–18)
Calcium, Total: 8.4 mg/dL — ABNORMAL LOW (ref 8.5–10.1)
Chloride: 103 mmol/L (ref 98–107)
Co2: 32 mmol/L (ref 21–32)
Creatinine: 1.24 mg/dL (ref 0.60–1.30)
GFR CALC NON AF AMER: 58 — AB
GLUCOSE: 156 mg/dL — AB (ref 65–99)
Osmolality: 281 (ref 275–301)
POTASSIUM: 3.3 mmol/L — AB (ref 3.5–5.1)
Sodium: 139 mmol/L (ref 136–145)
Total Protein: 6.2 g/dL — ABNORMAL LOW (ref 6.4–8.2)

## 2014-02-09 LAB — URINALYSIS, COMPLETE
Bacteria: NONE SEEN
Bilirubin,UR: NEGATIVE
Blood: NEGATIVE
Glucose,UR: NEGATIVE mg/dL (ref 0–75)
Ketone: NEGATIVE
Leukocyte Esterase: NEGATIVE
Nitrite: NEGATIVE
PH: 7 (ref 4.5–8.0)
Protein: NEGATIVE
SPECIFIC GRAVITY: 1.008 (ref 1.003–1.030)
Squamous Epithelial: NONE SEEN
WBC UR: <1 /HPF (ref 0–5)

## 2014-02-09 LAB — CBC
HCT: 39.1 % — ABNORMAL LOW (ref 40.0–52.0)
HGB: 13.3 g/dL (ref 13.0–18.0)
MCH: 31 pg (ref 26.0–34.0)
MCHC: 33.9 g/dL (ref 32.0–36.0)
MCV: 92 fL (ref 80–100)
Platelet: 110 10*3/uL — ABNORMAL LOW (ref 150–440)
RBC: 4.27 10*6/uL — ABNORMAL LOW (ref 4.40–5.90)
RDW: 14.7 % — AB (ref 11.5–14.5)
WBC: 3.9 10*3/uL (ref 3.8–10.6)

## 2014-02-09 LAB — LIPASE, BLOOD: Lipase: 125 U/L (ref 73–393)

## 2014-02-17 IMAGING — CT CT ABD-PELV W/O CM
2 of 4 series · 16 of 46 positions shown, 18 images · non-contrast
Comparison: 07/22/2006

CLINICAL DATA: Abdominal pain, blood in stool

EXAM:
CT ABDOMEN AND PELVIS WITHOUT CONTRAST
TECHNIQUE: Multidetector CT imaging of the abdomen and pelvis was performed
following the standard protocol without intravenous contrast.

[Series 2: routine abd pel without · axial · non-contrast · 0.81mm/px · z∈[-807,-342]mm · 13 of 103 slices shown, 15 images]
[im 5/103  soft-tissue]
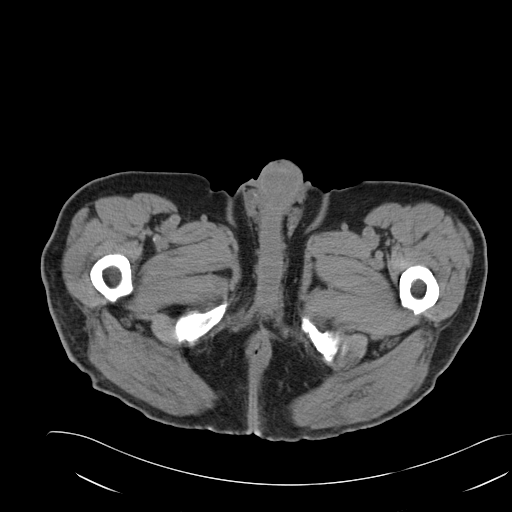
[im 5/103  bone]
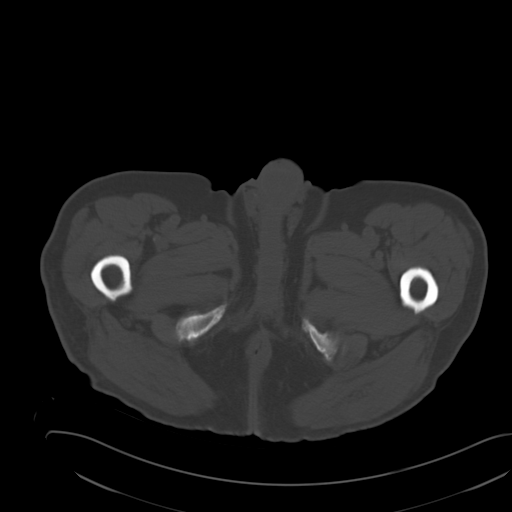
[im 14/103  soft-tissue]
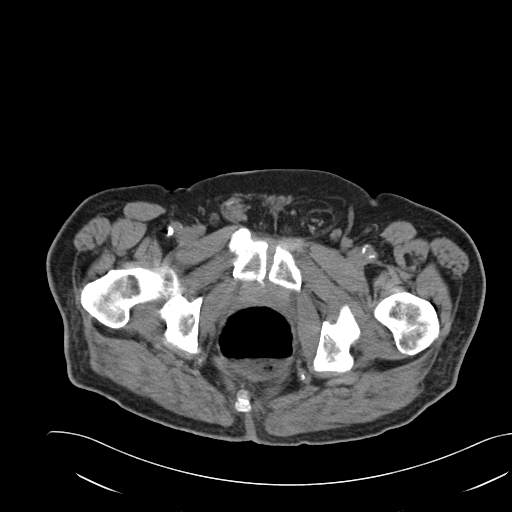
[im 23/103  soft-tissue]
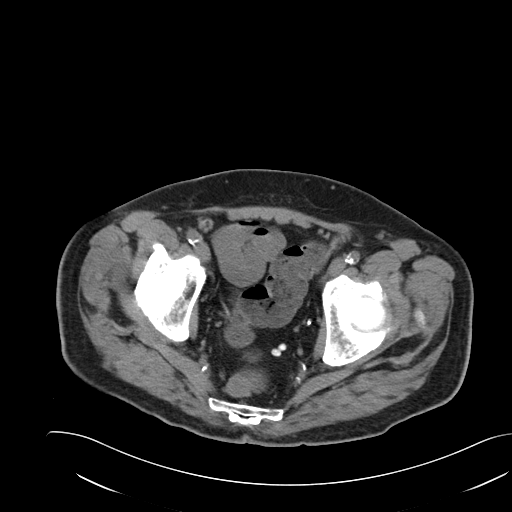
[im 27/103  soft-tissue]
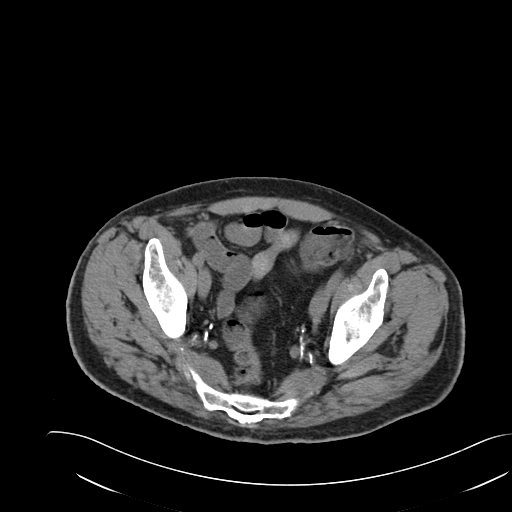
[im 36/103  soft-tissue]
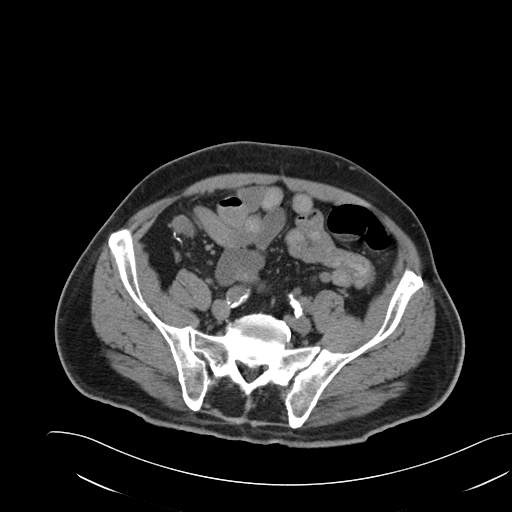
[im 45/103  soft-tissue]
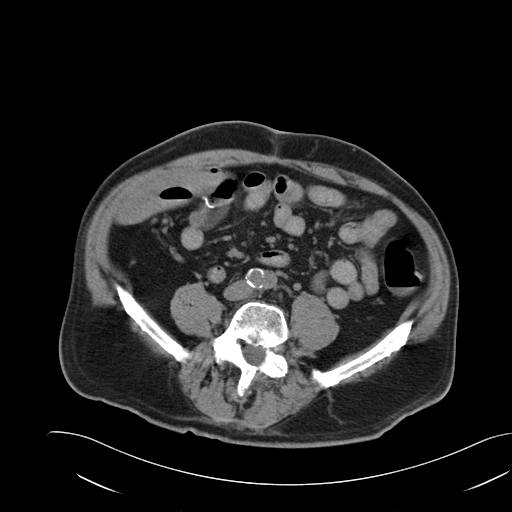
[im 54/103  soft-tissue]
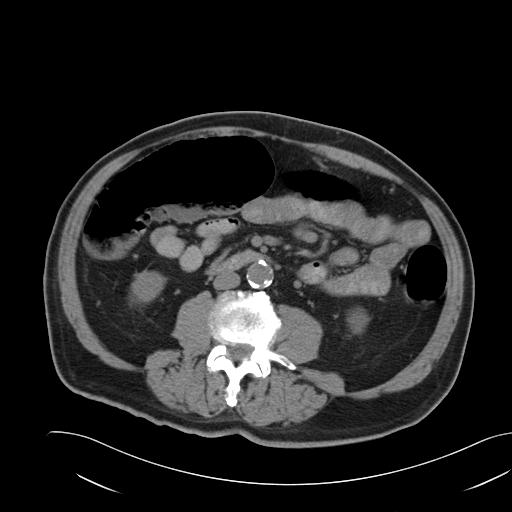
[im 58/103  soft-tissue]
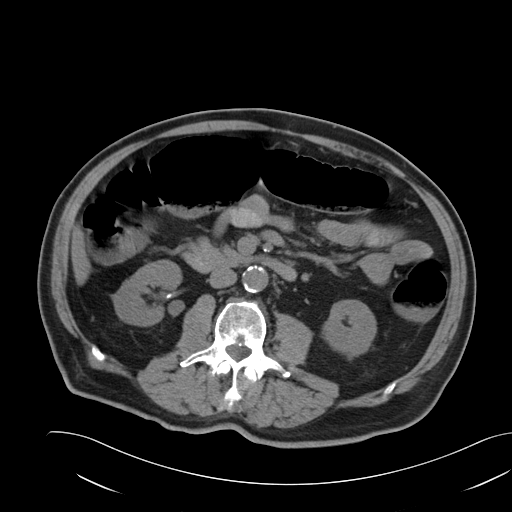
[im 67/103  soft-tissue]
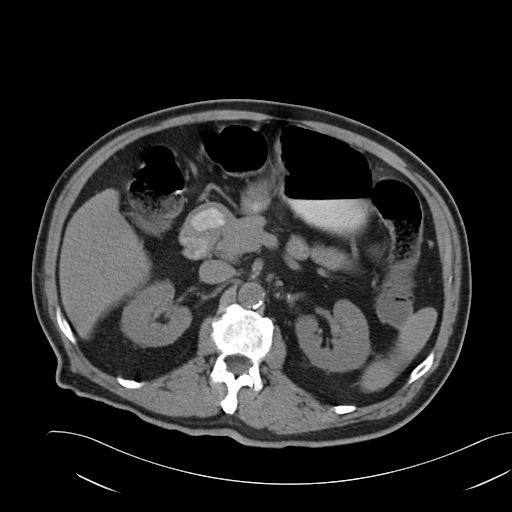
[im 67/103  bone]
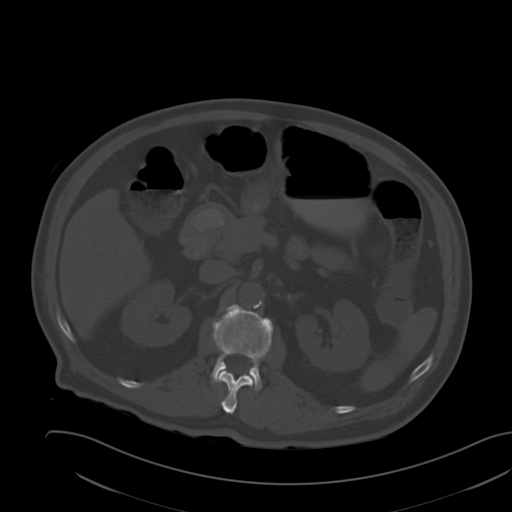
[im 76/103  soft-tissue]
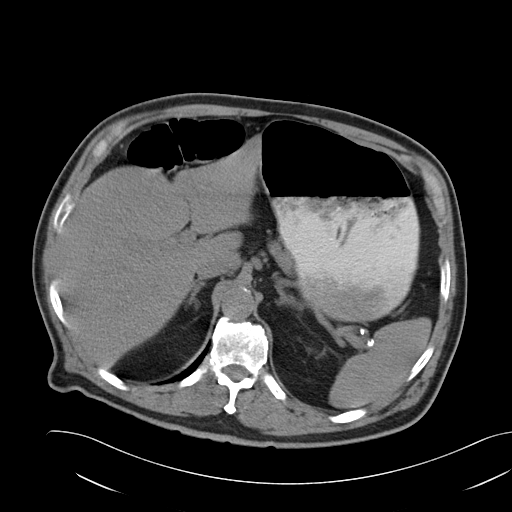
[im 80/103  soft-tissue]
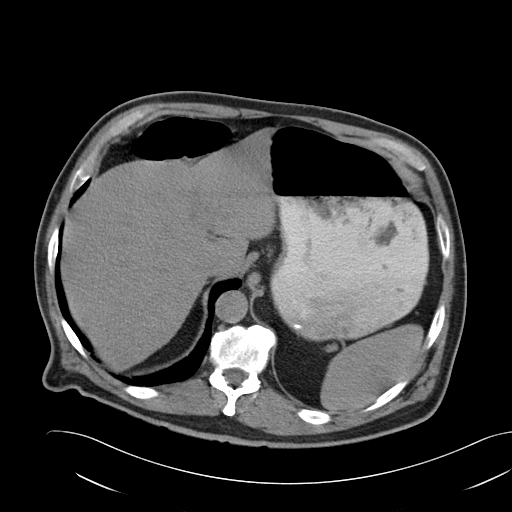
[im 89/103  soft-tissue]
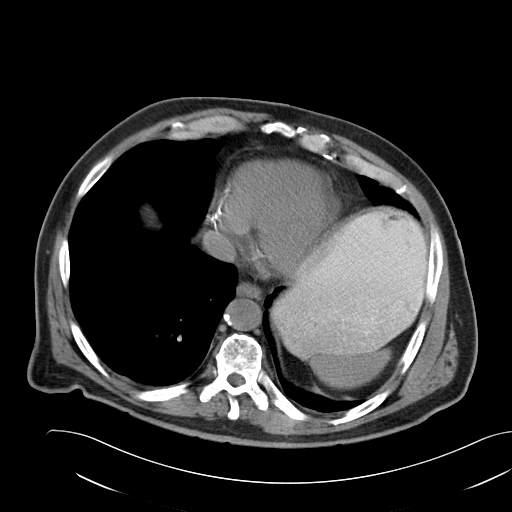
[im 98/103  soft-tissue]
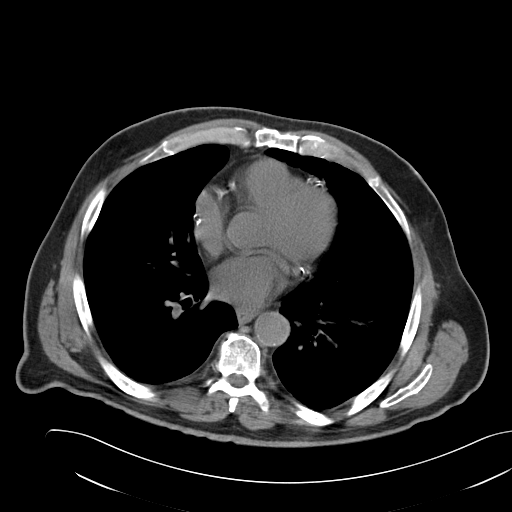

[Series 5: cor routine abd pel wo · coronal · 0.77mm/px · 3 of 138 slices shown]
[im 46/138  soft-tissue]
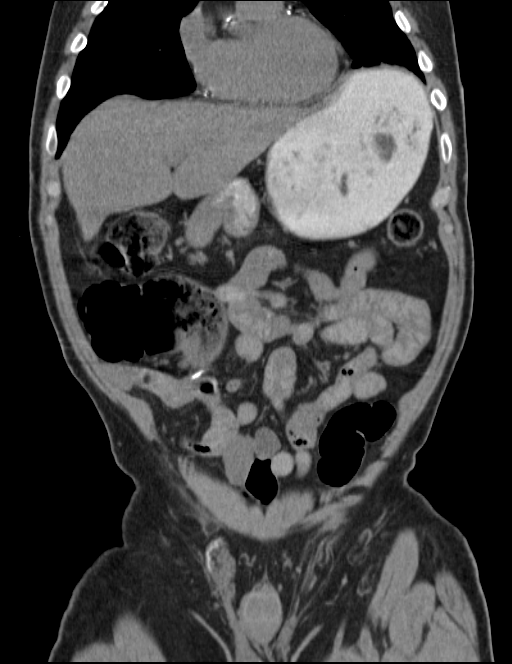
[im 61/138  soft-tissue]
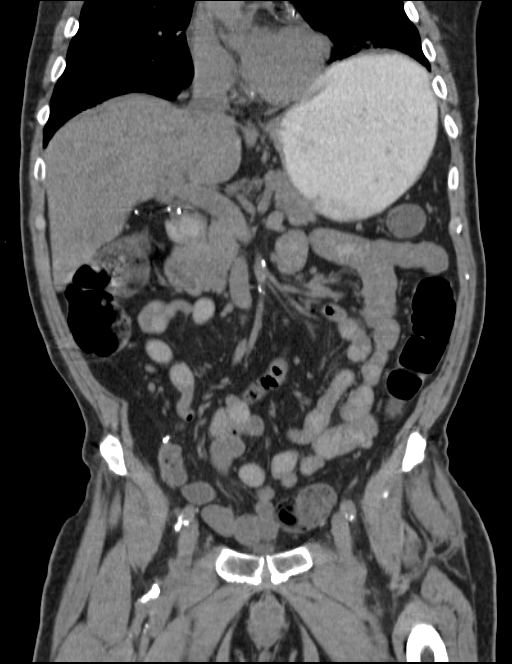
[im 77/138  soft-tissue]
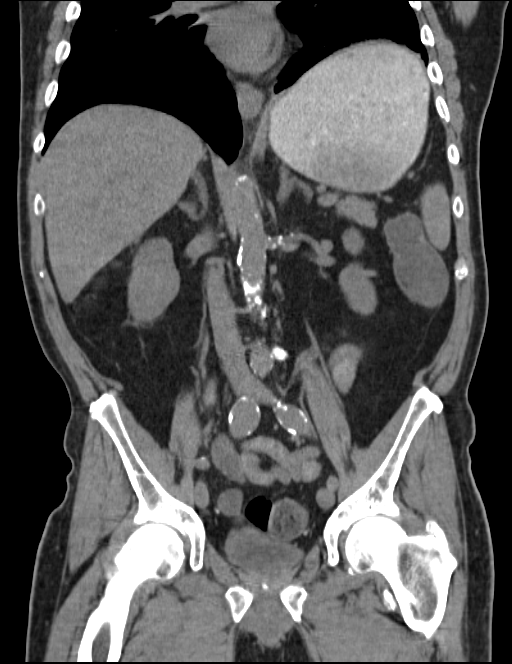

[16 of 46 positions shown; findings below may reference images not displayed]

FINDINGS: Mild scarring/atelectasis left lung base.

Coronary atherosclerosis. Postsurgical changes related to prior
CABG.

Contrast in the distal esophagus, suggesting esophageal dysmotility
or gastroesophageal reflux.

Unenhanced liver, spleen, pancreas, and adrenal glands are within
normal limits.

Status post cholecystectomy. No intrahepatic or extrahepatic ductal
dilatation.

Suspected punctate nonobstructing left upper pole renal calculus
(series 2/image 34). Additional probable renal vascular
calcifications. No hydronephrosis.

No evidence of bowel obstruction. Suspected prior appendectomy. No
colonic wall thickening or mass is seen.

Atherosclerotic calcifications of the abdominal aorta and branch
vessels. 2.2 cm right common iliac artery aneurysm (series 2/image
65).

No abdominopelvic ascites.

No suspicious abdominopelvic lymphadenopathy.

Suspected prior prostatectomy.

No ureteral or bladder calculi.  Bladder is unremarkable.

Degenerative changes of the visualized thoracolumbar spine. Stable
sclerotic lesion in the right L2 vertebral body, possibly reflecting
a benign bone island.
IMPRESSION: No evidence of bowel obstruction.  Prior appendectomy.

No colonic wall thickening or mass is seen.

Suspected punctate nonobstructing left upper pole renal calculus. No
ureteral or bladder calculi. No hydronephrosis.

Prior cholecystectomy and prostatectomy.

## 2014-05-17 ENCOUNTER — Observation Stay: Payer: Self-pay | Admitting: Internal Medicine

## 2014-05-17 LAB — COMPREHENSIVE METABOLIC PANEL
ALK PHOS: 52 U/L
ALT: 17 U/L
Albumin: 3.4 g/dL (ref 3.4–5.0)
Anion Gap: 12 (ref 7–16)
BILIRUBIN TOTAL: 0.5 mg/dL (ref 0.2–1.0)
BUN: 22 mg/dL — AB (ref 7–18)
CALCIUM: 9.2 mg/dL (ref 8.5–10.1)
Chloride: 99 mmol/L (ref 98–107)
Co2: 26 mmol/L (ref 21–32)
Creatinine: 1.38 mg/dL — ABNORMAL HIGH (ref 0.60–1.30)
EGFR (African American): 59 — ABNORMAL LOW
GFR CALC NON AF AMER: 51 — AB
GLUCOSE: 135 mg/dL — AB (ref 65–99)
OSMOLALITY: 279 (ref 275–301)
Potassium: 3.6 mmol/L (ref 3.5–5.1)
SGOT(AST): 15 U/L (ref 15–37)
SODIUM: 137 mmol/L (ref 136–145)
Total Protein: 7.6 g/dL (ref 6.4–8.2)

## 2014-05-17 LAB — URINALYSIS, COMPLETE
BILIRUBIN, UR: NEGATIVE
Bacteria: NONE SEEN
Blood: NEGATIVE
Glucose,UR: NEGATIVE mg/dL (ref 0–75)
Ketone: NEGATIVE
Leukocyte Esterase: NEGATIVE
Nitrite: NEGATIVE
Ph: 6 (ref 4.5–8.0)
Protein: NEGATIVE
RBC,UR: 1 /HPF (ref 0–5)
SPECIFIC GRAVITY: 1.012 (ref 1.003–1.030)
SQUAMOUS EPITHELIAL: NONE SEEN
WBC UR: <1 /HPF (ref 0–5)

## 2014-05-17 LAB — CBC
HCT: 37.8 % — AB (ref 40.0–52.0)
HGB: 13 g/dL (ref 13.0–18.0)
MCH: 31.3 pg (ref 26.0–34.0)
MCHC: 34.5 g/dL (ref 32.0–36.0)
MCV: 91 fL (ref 80–100)
Platelet: 156 10*3/uL (ref 150–440)
RBC: 4.16 10*6/uL — AB (ref 4.40–5.90)
RDW: 15.6 % — AB (ref 11.5–14.5)
WBC: 10.6 10*3/uL (ref 3.8–10.6)

## 2014-05-17 LAB — TROPONIN I: Troponin-I: <0.02 ng/mL

## 2014-05-18 ENCOUNTER — Emergency Department: Payer: Self-pay | Admitting: Emergency Medicine

## 2014-05-18 LAB — CBC WITH DIFFERENTIAL/PLATELET
Basophil #: 0 10*3/uL (ref 0.0–0.1)
Basophil %: 0.2 %
EOS ABS: 0.1 10*3/uL (ref 0.0–0.7)
EOS PCT: 1 %
HCT: 34 % — ABNORMAL LOW (ref 40.0–52.0)
HGB: 11.5 g/dL — AB (ref 13.0–18.0)
LYMPHS PCT: 30.1 %
Lymphocyte #: 2.1 10*3/uL (ref 1.0–3.6)
MCH: 30.9 pg (ref 26.0–34.0)
MCHC: 33.8 g/dL (ref 32.0–36.0)
MCV: 92 fL (ref 80–100)
MONO ABS: 0.8 x10 3/mm (ref 0.2–1.0)
MONOS PCT: 10.8 %
Neutrophil #: 4.1 10*3/uL (ref 1.4–6.5)
Neutrophil %: 57.9 %
Platelet: 129 10*3/uL — ABNORMAL LOW (ref 150–440)
RBC: 3.71 10*6/uL — AB (ref 4.40–5.90)
RDW: 15.5 % — ABNORMAL HIGH (ref 11.5–14.5)
WBC: 7 10*3/uL (ref 3.8–10.6)

## 2014-05-18 LAB — BASIC METABOLIC PANEL
Anion Gap: 8 (ref 7–16)
BUN: 27 mg/dL — ABNORMAL HIGH (ref 7–18)
CHLORIDE: 101 mmol/L (ref 98–107)
Calcium, Total: 7.7 mg/dL — ABNORMAL LOW (ref 8.5–10.1)
Co2: 31 mmol/L (ref 21–32)
Creatinine: 1.35 mg/dL — ABNORMAL HIGH (ref 0.60–1.30)
EGFR (African American): >60
GFR CALC NON AF AMER: 52 — AB
GLUCOSE: 98 mg/dL (ref 65–99)
OSMOLALITY: 284 (ref 275–301)
POTASSIUM: 3.3 mmol/L — AB (ref 3.5–5.1)
SODIUM: 140 mmol/L (ref 136–145)

## 2014-05-19 ENCOUNTER — Emergency Department: Payer: Self-pay | Admitting: Emergency Medicine

## 2014-05-24 ENCOUNTER — Emergency Department: Payer: Self-pay | Admitting: Emergency Medicine

## 2014-05-25 ENCOUNTER — Emergency Department: Payer: Self-pay | Admitting: Emergency Medicine

## 2014-06-14 ENCOUNTER — Emergency Department: Payer: Self-pay | Admitting: Emergency Medicine

## 2014-06-14 LAB — CBC
HCT: 33.7 % — ABNORMAL LOW (ref 40.0–52.0)
HGB: 11.3 g/dL — ABNORMAL LOW (ref 13.0–18.0)
MCH: 30.4 pg (ref 26.0–34.0)
MCHC: 33.4 g/dL (ref 32.0–36.0)
MCV: 91 fL (ref 80–100)
PLATELETS: 150 10*3/uL (ref 150–440)
RBC: 3.71 10*6/uL — ABNORMAL LOW (ref 4.40–5.90)
RDW: 15.8 % — AB (ref 11.5–14.5)
WBC: 6.4 10*3/uL (ref 3.8–10.6)

## 2014-06-15 LAB — BASIC METABOLIC PANEL
Anion Gap: 6 — ABNORMAL LOW (ref 7–16)
BUN: 29 mg/dL — ABNORMAL HIGH (ref 7–18)
CHLORIDE: 101 mmol/L (ref 98–107)
CO2: 33 mmol/L — AB (ref 21–32)
CREATININE: 1.47 mg/dL — AB (ref 0.60–1.30)
Calcium, Total: 8.5 mg/dL (ref 8.5–10.1)
EGFR (African American): >60
EGFR (Non-African Amer.): 50 — ABNORMAL LOW
GLUCOSE: 105 mg/dL — AB (ref 65–99)
OSMOLALITY: 286 (ref 275–301)
Potassium: 3.5 mmol/L (ref 3.5–5.1)
Sodium: 140 mmol/L (ref 136–145)

## 2014-06-15 LAB — TROPONIN I: Troponin-I: <0.02 ng/mL

## 2015-01-08 NOTE — Discharge Summary (Signed)
PATIENT NAME:  [name], CLASS MR#:  [id number] DATE OF BIRTH:  Apr 03, 1941  DATE OF ADMISSION:  05/17/2014  DATE OF DISCHARGE:  05/18/2014   ADMITTING DIAGNOSES:  1.  Decreased mobility.  2.  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  SECONDARY DISCHARGE DIAGNOSES:  1.  Coronary artery disease. 2.  Hypertension. 3.  Depression.   PROCEDURES: None.   CONSULTATIONS: None.   BRIEF HISTORY AND MAIN HOSPITAL COURSE: The patient is a 74 year old male with past medical history of advanced dementia, brought into the ED from [facility] for decreased immobility.  Please see the history and physical for details.  The patient was admitted to the hospital for decreased mobility. CAT scan of the head was negative and acute CVA was ruled out. Pelvic x-ray is negative for fractures.  Urinalysis was negative and no systolic infection were noticed. The patient was admitted under observation.  Physical therapy consult was placed.  The patient was subsequently evaluated by physical therapy today.  They were able to ambulate him with assistance. They have recommended a rolling walker and home PT.  Case management was consulted and home PT is arranged. The patient is getting transferred back to [facility] in stable condition. No other interventions were done during the hospital course.   ACTIVITY: As recommended by physical therapy.   DIET: Low salt, low fat, cardiac diet.   FOLLOWUP: Follow up with PCP in 1 week.   DISCHARGE MEDICATIONS: Atenolol 20 mg 1 tablet p.o. 2 times a day, hydrochlorothiazide 25 mg 1 tablet p.o. once daily, lisinopril 10 mg p.o. once daily, pravastatin 40 mg p.o. 2 times a day, aspirin 81 mg p.o. once daily, mirtazapine 15 mg 1 tablet p.o. once daily at bedtime, potassium chloride 10 mEq 2 tablets p.o. once daily at bedtime, escitalopram 10 mg 1 tablet p.o. once daily, polyethylene glycol 17 grams powder once daily, Maalox 30 mg  p.o. 4 times a day as needed for indigestion,  mapap 500 mg p.o. tablet 1 tablet p.o. every 4 hours as needed for fever up to 101, headache, and minor discomfort, milk of magnesium  13 mL p.o. once a day as needed for constipation, risperidone 0.5 mg 1 tablet p.o. once daily as needed for agitation, loperamide 1 tablet p.o. 8 times a day as needed for diarrhea, hydroxyzine hydrochloride 25 mg 1 tablet p.o. every 6 hours as needed for itching, divalproex 120 mg 2 capsules p.o. 2 times a day, nystatin 100,000 units topical powder apply to the affected right and left groin 3 times a day as needed for irritation, Q-Tussin 5 mL every 6 hours as needed for cough,  Risamine topical ointment apply to affected area every 4 hours as needed for  redness, triple antibiotic topically to affected area 4 times a day as needed for skin tears,  Zolpidem 5 mg p.o. at bedtime as needed for insomnia, alprazolam 0.25 mg 1 tablet p.o. once a day as needed for anxiety, torsemide 20 mg p.o. once daily.   SIGNIFICANT LABORATORIES AND IMAGING STUDIES: CT head: Negative. Troponin less than 0.02. WBC 7.0, hemoglobin 11.5, hematocrit is 34.0, platelets 129,000. Urinalysis nitrites and leukocytes are negative. Blood is negative. LFTs are normal.  BUN 27, creatinine 1.35, sodium 140, potassium 3.3, chloride 101, CO2 of 31, GFR greater than 60. Anion gap is 2. Serum osmolality 284, calcium 7.7.  Pelvic x-ray, no acute osseous abnormalities.   The patient will be transferred to [facility] in stable condition.   TOTAL TIME SPENT  ON THE DISCHARGE: 40 minutes.     ____________________________ Nicholes Mango, MD ag:DT D: 05/18/2014 16:26:31 ET T: 05/18/2014 17:06:34 ET JOB#: [id number]  cc: Nicholes Mango, MD, <Dictator> Nicholes Mango MD ELECTRONICALLY SIGNED 05/29/2014 15:30 Nicholes Mango MD ELECTRONICALLY SIGNED 05/29/2014 16:19

## 2015-01-08 NOTE — H&P (Signed)
PATIENT NAME:  [name], [name] MR#:  [id number] DATE OF BIRTH:  [birth year]  DATE OF ADMISSION:  05/17/2014  REFERRING EMERGENCY ROOM PHYSICIAN:  Lenard Lance, MD   PRIMARY CARE PHYSICIAN:  No local MD.   CHIEF COMPLAINT: Decreased mobility.   HISTORY OF PRESENT ILLNESS: This very pleasant 74 year old male with past medical history of advanced dementia, coronary artery disease, depression, who is a resident at [facility] presents today with new onset decreased mobility. Per the nursing home staff, he is usually independently mobile, and today he is unwilling to put weight on his legs. There has been no fall, no complaint of pain, no bruising, or other signs of trauma; he has no other new neurologic features. No change in his baseline mental status. He seems most reluctant to put weight on the left leg, is able to weight on the right leg.   PAST MEDICAL HISTORY: 1.  Advanced dementia.  2.  Coronary artery disease.  3.  Retinal detachment.  4.  Depressive disorder.  5.  Hypertension.  6.  Bilateral cataract.   SOCIAL HISTORY: The patient is currently a resident at [facility]; he does not currently smoke cigarettes, abuse alcohol or illicit substances. He is unable to provide historical data due to advanced dementia.   ALLERGIES: THE PATIENT IS ALLERGIC TO WELLBUTRIN.   HOME MEDICATIONS: 1.  Alprazolam 0.25 mg 1 tablet by mouth twice a day.  2.  Aspirin 81 mg 1 tablet daily.  3.  Atenolol 25 mg 1 tablet twice a day.  4.  Depakote 125 mg capsule take 2 capsules by mouth twice a day.  5.  Escitalopram 110 mg tablet 1 tablet daily.  6.  Hydrochlorothiazide 25 mg 1 tablet daily.  7.  Lisinopril 10 mg 1 tablet daily.  8.  Polyethylene glycol 17 grams in 8 ounces of water daily.  9.  Potassium chloride 10 mEq on 2 tablets every night at bedtime.  10.  Pravastatin 40 mg 1 tablet twice a day.  11.  Risperidone 0.5 mg 1 tablet twice a day.  12.  Torsemide 20 mg 1 tablet daily.   13.  Alprazolam 0.25 mg 1 tablet once a day as needed for anxiety.  14.  Hydroxyzine 25 mg 1 tablet every 6 hours as needed for itching.  15.  Loperamide 2 mg 1 capsule by mouth with each loose stool as needed for diarrhea.  16.  Mapap 500 mg 1 tablet every 4 hours as needed for pain or fever.  17.  Milk of magnesia 400/5 mL take 30 mL by mouth at bedtime as needed for constipation.  18.  Nystatin 100,000 powder apply topically to right and left groin area 3 times a day as needed for rash.  10.  Respirdone 0.5 mg 1 tablet as 1 tablet daily as needed for agitation.  11.  Zolpidem 5 mg 1 tablet every night at bedtime as needed for insomnia.   REVIEW OF SYSTEMS: The patient is unable to provide a review of systems due to advanced dementia.   PHYSICAL EXAMINATION: VITAL SIGNS: Temperature 98.8, pulse 60, respirations 16, blood pressure 103/48, oxygenation 94% on room air.  GENERAL: The patient is in no distress, resting comfortably on the exam bed.  HEENT: The left pupil is dilated and minimally reactive to light, the right pupil is appropriately, reactive; conjunctivae are clear, no icterus or injection; extraocular motion is intact; oral mucous membranes are pink and moist, good dentition, no oral lesions, posterior  oropharynx is clear with no exudate.  NECK: There is no cervical lymphadenopathy. Trachea is midline. Thyroid is nontender. No thyroid nodule is noted.  PULMONARY: The patient is unable to sit up for this examination; anterior examination shows clear lungs bilaterally with good air movement.  CARDIOVASCULAR: Regular rate and rhythm, no murmurs, rubs, or gallops, heart sounds are distant.  ABDOMEN: Bowel sounds are positive. Abdomen is soft, nontender, nondistended. No guarding, no hepatosplenomegaly, no mass.  EXTREMITIES: The patient is able to move all 4 extremities. There are no tender or swollen joints. No joint deformity.  SKIN: No rash, no open wounds, no bruising.  NEUROLOGIC:  The patient has baseline advanced dementia, he is very slow to respond to commands and questions, he is alert, he is not oriented to time or place; cranial nerves II through XII seem to be intact with the exception of the fixed left pupil, strength is 5 out of 5 in the upper extremities, strength is 4 out of 5 in the lower extremities on supine examination; deep tendon reflexes are equal and appropriate, he is unable to perform a cerebellar examination.  PSYCHIATRIC: The patient is calm. No signs of uncontrolled anxiety or depression at this time.   LABORATORY: Troponin is less than 0.02, white blood cells 10.6, hemoglobin 13.0, platelets 156,000, MCV is 91, sodium 137, potassium 3.6, chloride is 99, bicarbonate is 26, BUN is 22; creatinine 1.38, serum glucose 135; LFTs are normal; urine is negative for signs of infection.   IMAGING:  1.  Noncontrast CT of the head shows no acute intracranial findings, shows chronic atrophy and microvascular disease, which is stable.  2.  AP view of the pelvis shows no acute osseous abnormalities. No acute fracture-dislocation or bony destruction.   ASSESSMENT AND PLAN: 1.  Decreased mobility: CT of the head is negative for acute stroke; pelvic x-ray negative for fracture. In the stretcher, the patient has equal strength bilaterally, but he is unwilling to bear weight on the left leg. There are no signs of systemic infection with a negative urinalysis. We will admit for observation and a full physical therapy consultation. He has no other neurologic changes from his baseline. Given his ability to move the leg while laying down I do not think that he has had a stroke to cause this weakness. 2.  Advanced dementia: The patient is conversant, disoriented, seems to be at his baseline mental status.  3.  Coronary artery disease: No complaints of chest pain; no EKG changes; troponins are negative. Continue with aspirin, statin, atenolol.  4.  Hypertension: Blood pressure is  borderline low at this time. Would continue with lisinopril and torsemide, hold hydrochlorothiazide for now, and monitor blood pressure.  5.  Depression. Continue mirtazapine, Celexa, Depakote, Xanax, risperidone, and zolpidem as needed.   TIME SPENT ON ADMISSION:   40 minutes.    ____________________________ Earleen Newport. Volanda Napoleon, MD cpw:nt D: 05/17/2014 16:58:34 ET T: 05/17/2014 17:55:39 ET JOB#: [id number]  cc: Barnetta Chapel P. Volanda Napoleon, MD, <Dictator> Aldean Jewett MD ELECTRONICALLY SIGNED 05/17/2014 21:04

## 2015-02-01 ENCOUNTER — Emergency Department
Admission: EM | Admit: 2015-02-01 | Discharge: 2015-02-07 | Disposition: A | Discharge status: Other Acute Inpatient Hospital | Payer: Medicare Other | Attending: Emergency Medicine | Admitting: Emergency Medicine

## 2015-02-01 DIAGNOSIS — Z87891 Personal history of nicotine dependence: Secondary | ICD-10-CM | POA: Diagnosis not present

## 2015-02-01 DIAGNOSIS — G309 Alzheimer's disease, unspecified: Secondary | ICD-10-CM | POA: Insufficient documentation

## 2015-02-01 DIAGNOSIS — I10 Essential (primary) hypertension: Secondary | ICD-10-CM | POA: Insufficient documentation

## 2015-02-01 DIAGNOSIS — R451 Restlessness and agitation: Secondary | ICD-10-CM

## 2015-02-01 DIAGNOSIS — F0280 Dementia in other diseases classified elsewhere without behavioral disturbance: Secondary | ICD-10-CM | POA: Diagnosis not present

## 2015-02-01 DIAGNOSIS — Z008 Encounter for other general examination: Secondary | ICD-10-CM | POA: Diagnosis present

## 2015-02-01 LAB — CBC WITH DIFFERENTIAL/PLATELET
Basophils Absolute: 0 10*3/uL (ref 0–0.1)
Basophils Relative: 0 %
EOS PCT: 1 %
Eosinophils Absolute: 0 10*3/uL (ref 0–0.7)
HEMATOCRIT: 37.1 % — AB (ref 40.0–52.0)
HEMOGLOBIN: 12.3 g/dL — AB (ref 13.0–18.0)
LYMPHS ABS: 1.9 10*3/uL (ref 1.0–3.6)
LYMPHS PCT: 26 %
MCH: 30 pg (ref 26.0–34.0)
MCHC: 33.1 g/dL (ref 32.0–36.0)
MCV: 90.6 fL (ref 80.0–100.0)
MONO ABS: 0.5 10*3/uL (ref 0.2–1.0)
MONOS PCT: 8 %
NEUTROS ABS: 4.6 10*3/uL (ref 1.4–6.5)
Neutrophils Relative %: 65 %
Platelets: 163 10*3/uL (ref 150–440)
RBC: 4.1 MIL/uL — AB (ref 4.40–5.90)
RDW: 14.5 % (ref 11.5–14.5)
WBC: 7.1 10*3/uL (ref 3.8–10.6)

## 2015-02-01 LAB — ETHANOL
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
## 2015-02-01 NOTE — ED Notes (Signed)

## 2015-02-01 NOTE — ED Notes (Signed)
Dr. Goodman at bedside.  

## 2015-02-01 NOTE — ED Provider Notes (Signed)
[facility] Emergency Department Provider Note    ____________________________________________  Time seen: 2125  I have reviewed the triage vital signs and the nursing notes.   HISTORY  Chief Complaint Psychiatric Evaluation   History limited by: dementia   HPI [name] is a 74 y.o. male he was sent from [place] for psychiatric evaluation. Per documentation patient has been aggressive towards other residents. The patient himself denies being aggressive towards any patients. States he gets along well with [place]. Denies being upset or angry. denies any medical complaints. Denies any fevers, chest pain or shortness breath.     Past Medical History  Diagnosis Date  . Coronary artery disease   . Hyperlipidemia   . Hypertension   . Bilateral calf pain   . SOB (shortness of breath)   . GERD (gastroesophageal reflux disease)   . Anxiety   . OA (osteoarthritis)     Patient Active Problem List   Diagnosis Date Noted  . CAD (coronary artery disease) 05/23/2011  . HTN (hypertension) 05/23/2011  . Hyperlipidemia 05/23/2011    Past Surgical History  Procedure Laterality Date  . Cardiac catheterization  05/06/2009    EF 40-45%  . Coronary artery bypass graft    . Cardiovascular stress test  12/11/2005    EF 42%    Current Outpatient Rx  Name  Route  Sig  Dispense  Refill  . aspirin EC 81 MG tablet   Oral   Take 1 tablet (81 mg total) by mouth daily.         Marland Kitchen atenolol (TENORMIN) 25 MG tablet      TAKE ONE TABLET BY MOUTH TWICE DAILY   60 tablet   5   . hydrochlorothiazide (HYDRODIURIL) 25 MG tablet   Oral   Take 1 tablet (25 mg total) by mouth daily.   90 tablet   3   . lisinopril (PRINIVIL,ZESTRIL) 10 MG tablet   Oral   Take 1 tablet (10 mg total) by mouth daily.   90 tablet   3   . Multiple Vitamin (MULTIVITAMIN) tablet   Oral   Take 1 tablet by mouth daily.           . potassium chloride SA (KLOR-CON M20)  20 MEQ tablet   Oral   Take 1 tablet (20 mEq total) by mouth daily.   30 tablet   0     NEED APPOINTMENT [phone]   . pravastatin (PRAVACHOL) 40 MG tablet   Oral   Take 1 tablet (40 mg total) by mouth 2 (two) times daily.   180 tablet   1     Allergies Ciprofloxacin; Diclofenac; Oxybutynin chloride; Wellbutrin; and Zocor  Family History  Problem Relation Age of Onset  . Heart disease Father     Social History History  Substance Use Topics  . Smoking status: Former Smoker    Quit date: 09/17/1998  . Smokeless tobacco: Not on file  . Alcohol Use: No    Review of Systems  Constitutional: Negative for fever. Cardiovascular: Negative for chest pain. Respiratory: Negative for shortness of breath. Gastrointestinal: Negative for abdominal pain, vomiting and diarrhea. Genitourinary: Negative for dysuria. Musculoskeletal: Negative for back pain. Skin: Negative for rash. Neurological: Negative for headaches, focal weakness or numbness.   10-point ROS otherwise negative.  ____________________________________________   PHYSICAL EXAM:   Constitutional: awake and alert, no acute distress. Not oriented. Eyes: Conjunctivae are normal. PERRL. Normal extraocular movements. ENT   Head: Normocephalic  and atraumatic.   Nose: No congestion/rhinnorhea.   Mouth/Throat: Mucous membranes are moist.   Neck: No stridor. Hematological/Lymphatic/Immunilogical: No cervical lymphadenopathy. Cardiovascular: Normal rate, regular rhythm.  No murmurs, rubs, or gallops. Respiratory: Normal respiratory effort without tachypnea nor retractions. Breath sounds are clear and equal bilaterally. No wheezes/rales/rhonchi. Gastrointestinal: Soft and nontender. No distention.  Genitourinary: Deferred Musculoskeletal: Normal range of motion in all extremities. No joint effusions.  No lower extremity tenderness nor edema. Neurologic:  Normal speech. Moves all extremities. Not oriented  to place, event, time. Skin:  Skin is warm, dry and intact. No rash noted.   ____________________________________________    LABS (pertinent positives/negatives)  Labs Reviewed  CBC WITH DIFFERENTIAL/PLATELET - Abnormal; Notable for the following:    RBC 4.10 (*)    Hemoglobin 12.3 (*)    HCT 37.1 (*)    All other components within normal limits  COMPREHENSIVE METABOLIC PANEL - Abnormal; Notable for the following:    Potassium 2.8 (*)    Chloride 99 (*)    Glucose, Bld 141 (*)    BUN 34 (*)    Creatinine, Ser 1.41 (*)    Calcium 8.5 (*)    Total Protein 6.3 (*)    GFR calc non Af Amer 48 (*)    GFR calc Af Amer 56 (*)    All other components within normal limits  ETHANOL  URINE RAPID DRUG SCREEN (HOSP PERFORMED)     ____________________________________________   EKG  None  ____________________________________________    RADIOLOGY  None  ____________________________________________   PROCEDURES  Procedure(s) performed: None  Critical Care performed: No  ____________________________________________   INITIAL IMPRESSION / ASSESSMENT AND PLAN / ED COURSE  Pertinent labs & imaging results that were available during my care of the patient were reviewed by me and considered in my medical decision making (see chart for details).  Patient here from [place] for psychiatric evaluation. Patient has been aggressive towards other residents. On exam here patient, and pleasant. Patient himself denies any aggression. Will have patient evaluated by psychiatry.  ____________________________________________   FINAL CLINICAL IMPRESSION(S) / ED DIAGNOSES  Final diagnoses:  Agitation     Nance Pear, MD 02/01/15 [phone]

## 2015-02-01 NOTE — ED Notes (Signed)
Changed pt into hospital scrubs, pt's clothes placed in pt's belongings bag and put bag in [place] locker.

## 2015-02-02 ENCOUNTER — Encounter: Payer: Self-pay | Admitting: Emergency Medicine

## 2015-02-02 DIAGNOSIS — G309 Alzheimer's disease, unspecified: Secondary | ICD-10-CM

## 2015-02-02 DIAGNOSIS — F0280 Dementia in other diseases classified elsewhere without behavioral disturbance: Secondary | ICD-10-CM

## 2015-02-02 LAB — COMPREHENSIVE METABOLIC PANEL
ALBUMIN: 3.5 g/dL (ref 3.5–5.0)
ALK PHOS: 51 U/L (ref 38–126)
ALT: 18 U/L (ref 17–63)
ANION GAP: 10 (ref 5–15)
AST: 28 U/L (ref 15–41)
BILIRUBIN TOTAL: 0.5 mg/dL (ref 0.3–1.2)
BUN: 34 mg/dL — ABNORMAL HIGH (ref 6–20)
CALCIUM: 8.5 mg/dL — AB (ref 8.9–10.3)
CHLORIDE: 99 mmol/L — AB (ref 101–111)
CO2: 31 mmol/L (ref 22–32)
CREATININE: 1.41 mg/dL — AB (ref 0.61–1.24)
GFR calc Af Amer: 56 mL/min — ABNORMAL LOW (ref >60)
GFR calc non Af Amer: 48 mL/min — ABNORMAL LOW (ref >60)
Glucose, Bld: 141 mg/dL — ABNORMAL HIGH (ref 65–99)
Potassium: 2.8 mmol/L — ABNORMAL LOW (ref 3.5–5.1)
Sodium: 140 mmol/L (ref 135–145)
Total Protein: 6.3 g/dL — ABNORMAL LOW (ref 6.5–8.1)

## 2015-02-02 LAB — POTASSIUM
POTASSIUM: 4.4 mmol/L (ref 3.5–5.1)
Potassium: 3.5 mmol/L (ref 3.5–5.1)

## 2015-02-02 MED ORDER — QUETIAPINE FUMARATE 25 MG PO TABS
ORAL_TABLET | ORAL | Status: AC
  Filled 2015-02-02: qty 1

## 2015-02-02 MED ORDER — QUETIAPINE FUMARATE 25 MG PO TABS
25.0000 mg | ORAL_TABLET | Freq: Two times a day (BID) | ORAL | Status: DC
  Administered 2015-02-03 – 2015-02-07 (×9): 25 mg via ORAL

## 2015-02-02 MED ORDER — QUETIAPINE FUMARATE 200 MG PO TABS
ORAL_TABLET | ORAL | Status: AC
  Administered 2015-02-02: 100 mg via ORAL
  Filled 2015-02-02: qty 1

## 2015-02-02 MED ORDER — DIVALPROEX SODIUM 250 MG PO DR TAB
DELAYED_RELEASE_TABLET | ORAL | Status: AC
  Administered 2015-02-02: 250 mg via ORAL
  Filled 2015-02-02: qty 1

## 2015-02-02 MED ORDER — HYDROCHLOROTHIAZIDE 25 MG PO TABS
25.0000 mg | ORAL_TABLET | Freq: Every day | ORAL | Status: DC
  Administered 2015-02-02 – 2015-02-07 (×4): 25 mg via ORAL

## 2015-02-02 MED ORDER — ATENOLOL 25 MG PO TABS
25.0000 mg | ORAL_TABLET | Freq: Two times a day (BID) | ORAL | Status: DC
  Administered 2015-02-02 – 2015-02-07 (×7): 25 mg via ORAL
  Filled 2015-02-02 (×12): qty 1

## 2015-02-02 MED ORDER — POLYETHYLENE GLYCOL 3350 17 G PO PACK
17.0000 g | PACK | Freq: Every day | ORAL | Status: DC
  Administered 2015-02-02 – 2015-02-07 (×4): 17 g via ORAL
  Filled 2015-02-02 (×6): qty 1

## 2015-02-02 MED ORDER — POTASSIUM CHLORIDE CRYS ER 20 MEQ PO TBCR
EXTENDED_RELEASE_TABLET | ORAL | Status: AC
  Administered 2015-02-02: 60 meq via ORAL
  Filled 2015-02-02: qty 3

## 2015-02-02 MED ORDER — DIVALPROEX SODIUM 250 MG PO DR TAB
250.0000 mg | DELAYED_RELEASE_TABLET | Freq: Three times a day (TID) | ORAL | Status: DC
  Administered 2015-02-02 – 2015-02-07 (×15): 250 mg via ORAL
  Filled 2015-02-02 (×7): qty 1

## 2015-02-02 MED ORDER — HYDROCHLOROTHIAZIDE 25 MG PO TABS
ORAL_TABLET | ORAL | Status: AC
  Administered 2015-02-02: 25 mg via ORAL
  Filled 2015-02-02: qty 1

## 2015-02-02 MED ORDER — POTASSIUM CHLORIDE 20 MEQ PO PACK
PACK | ORAL | Status: AC
  Administered 2015-02-02: 60 meq
  Filled 2015-02-02: qty 3

## 2015-02-02 MED ORDER — TORSEMIDE 20 MG PO TABS
20.0000 mg | ORAL_TABLET | Freq: Every day | ORAL | Status: DC
  Administered 2015-02-02 – 2015-02-07 (×6): 20 mg via ORAL
  Filled 2015-02-02 (×7): qty 1

## 2015-02-02 MED ORDER — LORAZEPAM 2 MG/ML IJ SOLN
INTRAMUSCULAR | Status: AC
  Filled 2015-02-02: qty 1

## 2015-02-02 MED ORDER — QUETIAPINE FUMARATE 25 MG PO TABS: 25.0000 mg | ORAL_TABLET | Freq: Two times a day (BID) | ORAL | Status: DC

## 2015-02-02 MED ORDER — ESCITALOPRAM OXALATE 10 MG PO TABS
10.0000 mg | ORAL_TABLET | Freq: Every day | ORAL | Status: DC
  Administered 2015-02-02 – 2015-02-07 (×6): 10 mg via ORAL
  Filled 2015-02-02 (×6): qty 1

## 2015-02-02 MED ORDER — LISINOPRIL 20 MG PO TABS
ORAL_TABLET | ORAL | Status: AC
  Administered 2015-02-02: 10 mg via ORAL
  Filled 2015-02-02: qty 1

## 2015-02-02 MED ORDER — ASPIRIN EC 81 MG PO TBEC
DELAYED_RELEASE_TABLET | ORAL | Status: AC
  Administered 2015-02-02: 81 mg via ORAL
  Filled 2015-02-02: qty 1

## 2015-02-02 MED ORDER — QUETIAPINE FUMARATE 25 MG PO TABS
100.0000 mg | ORAL_TABLET | Freq: Every day | ORAL | Status: DC
  Administered 2015-02-02 – 2015-02-06 (×6): 100 mg via ORAL

## 2015-02-02 MED ORDER — LISINOPRIL 20 MG PO TABS
10.0000 mg | ORAL_TABLET | Freq: Every day | ORAL | Status: DC
  Administered 2015-02-02 – 2015-02-07 (×5): 10 mg via ORAL

## 2015-02-02 MED ORDER — LORAZEPAM 2 MG/ML IJ SOLN
1.0000 mg | Freq: Once | INTRAMUSCULAR | Status: AC
  Administered 2015-02-02: 1 mg via INTRAMUSCULAR

## 2015-02-02 MED ORDER — ASPIRIN EC 81 MG PO TBEC
81.0000 mg | DELAYED_RELEASE_TABLET | Freq: Every day | ORAL | Status: DC
  Administered 2015-02-02 – 2015-02-07 (×6): 81 mg via ORAL

## 2015-02-02 MED ORDER — RIVASTIGMINE 4.6 MG/24HR TD PT24
4.6000 mg | MEDICATED_PATCH | Freq: Every day | TRANSDERMAL | Status: DC
  Administered 2015-02-02 – 2015-02-07 (×6): 4.6 mg via TRANSDERMAL
  Filled 2015-02-02 (×7): qty 1

## 2015-02-02 MED ORDER — PRAVASTATIN SODIUM 40 MG PO TABS
40.0000 mg | ORAL_TABLET | Freq: Two times a day (BID) | ORAL | Status: DC
  Administered 2015-02-02 – 2015-02-07 (×10): 40 mg via ORAL
  Filled 2015-02-02 (×12): qty 1

## 2015-02-02 MED ORDER — POTASSIUM CHLORIDE CRYS ER 20 MEQ PO TBCR
60.0000 meq | EXTENDED_RELEASE_TABLET | Freq: Two times a day (BID) | ORAL | Status: DC
  Administered 2015-02-02 (×2): 60 meq via ORAL

## 2015-02-02 MED ORDER — POTASSIUM CHLORIDE 20 MEQ PO PACK
20.0000 meq | PACK | Freq: Every day | ORAL | Status: DC
  Administered 2015-02-03 – 2015-02-06 (×4): 20 meq via ORAL

## 2015-02-02 NOTE — ED Notes (Addendum)
BEHAVIORAL HEALTH ROUNDING Patient sleeping: No Patient alert and oriented: yes Behavior appropriate: Yes; If no, describe:  Nutrition and fluids offered:Pt sleeping  Toileting and hygiene offered: Pt sleeping  Sitter present: yes Law enforcement present: Yes

## 2015-02-02 NOTE — ED Notes (Signed)
BEHAVIORAL HEALTH ROUNDING Patient sleeping: No. Patient alert and oriented: no Behavior appropriate: No.; If no, describe: Pt attempted to walk out of ED, pt became agitated with staff. Per MD pt under IVC.  Nutrition and fluids offered: Yes  Toileting and hygiene offered: Yes  Sitter present: yes Law enforcement present: Yes ODS

## 2015-02-02 NOTE — BH Assessment (Deleted)
Assessment Note  [name] is an 74 y.o. male Who presents to the ER seeking assistance with detox and SA Treatment. He reports of being on a 3-week binge. He was drinking 8 to 10, 40oz beers. He also admits to smoking THC and Crack Cocaine. However, his primary substance use is alcohol. According to him, the longest he's been clean is 17 years. Prior to the 3-week binge, the pt. was sober for 18 months. His reported symptoms of withdrawal are; cold chills & sweats, dizziness and shakiness.  He denies any history of seizures but reports of having several blackouts in the past. However, he hasn't had any within the 3-week binge. He denies SI/HI and AV/H. Her reports of having no medical concerns nor is he prescribed medications.    Axis I: Alcohol Abuse and Substance Induced Mood Disorder Axis III:  Past Medical History  Diagnosis Date  . Coronary artery disease   . Hyperlipidemia   . Hypertension   . Bilateral calf pain   . SOB (shortness of breath)   . GERD (gastroesophageal reflux disease)   . Anxiety   . OA (osteoarthritis)    Axis IV: economic problems, housing problems, occupational problems, other psychosocial or environmental problems, problems related to social environment and problems with primary support group  Past Medical History:  Past Medical History  Diagnosis Date  . Coronary artery disease   . Hyperlipidemia   . Hypertension   . Bilateral calf pain   . SOB (shortness of breath)   . GERD (gastroesophageal reflux disease)   . Anxiety   . OA (osteoarthritis)     Past Surgical History  Procedure Laterality Date  . Cardiac catheterization  05/06/2009    EF 40-45%  . Coronary artery bypass graft    . Cardiovascular stress test  12/11/2005    EF 42%    Family History:  Family History  Problem Relation Age of Onset  . Heart disease Father     Social History:  reports that he quit smoking about 16 years ago. He does not have any smokeless tobacco history  on file. He reports that he does not drink alcohol or use illicit drugs.  Additional Social History:  Alcohol / Drug Use Pain Medications: None Reported Prescriptions: None Reported Over the Counter: None Reported History of alcohol / drug use?: Yes Longest period of sobriety (when/how long): 17 years Negative Consequences of Use: Personal relationships, Work / Youth worker Withdrawal Symptoms: Sweats, Fever / Chills, Tremors, Nausea / Vomiting, Weakness Substance #1 Name of Substance 1: Alcohol 1 - Age of First Use: 14 1 - Amount (size/oz): 8 to 10, 40oz Beers 1 - Frequency: Daily 1 - Duration: 3 weeks 1 - Last Use / Amount: 02/01/2015 Substance #2 Name of Substance 2: THC 2 - Age of First Use: 15 2 - Amount (size/oz): "2 Puffs" 2 - Frequency: 2x month 2 - Duration: 1 year 2 - Last Use / Amount: 01/19/2015 Substance #3 Name of Substance 3: Crack Cocaine 3 - Age of First Use: 41 3 - Amount (size/oz): $40 3 - Frequency: 3x to 4x a Month 3 - Duration: "Years" 3 - Last Use / Amount: 01/30/2015  CIWA: CIWA-Ar BP: (!) 106/57 mmHg Pulse Rate: (!) 52 COWS:    Allergies:  Allergies  Allergen Reactions  . Ciprofloxacin   . Diclofenac   . Oxybutynin Chloride   . Wellbutrin [Bupropion Hcl]   . Zocor [Simvastatin]     Home Medications:  (Not in  a hospital admission)  OB/GYN Status:  No LMP for male patient.  General Assessment Data Location of Assessment: [facility] ED TTS Assessment: In system Is this a Tele or Face-to-Face Assessment?: Face-to-Face Is this an Initial Assessment or a Re-assessment for this encounter?: Initial Assessment Marital status: Single Maiden name: n/a Is patient pregnant?: No Pregnancy Status: No Living Arrangements: Non-relatives/Friends Can pt return to current living arrangement?: Yes Admission Status: Voluntary Is patient capable of signing voluntary admission?: Yes Referral Source: Self/Family/Friend Insurance type: None  Medical Screening  Exam ([place]) Medical Exam completed: Yes  Crisis Care Plan Living Arrangements: Non-relatives/Friends Name of Psychiatrist: n/a Name of Therapist: n/a  Education Status Is patient currently in school?: No Current Grade: n/a Highest grade of school patient has completed: 12 Name of school: n/a Contact person: n/a  Risk to self with the past 6 months Suicidal Ideation: No Has patient been a risk to self within the past 6 months prior to admission? : No Suicidal Intent: No Has patient had any suicidal intent within the past 6 months prior to admission? : No Is patient at risk for suicide?: No Suicidal Plan?: No Has patient had any suicidal plan within the past 6 months prior to admission? : No Access to Means: No What has been your use of drugs/alcohol within the last 12 months?: Alcohol, THC & Crack Cocaine Previous Attempts/Gestures: No How many times?: 0 Other Self Harm Risks: None reported Triggers for Past Attempts: None known Intentional Self Injurious Behavior: None Family Suicide History: No Recent stressful life event(s): Other (Comment) (Addication) Persecutory voices/beliefs?: No Depression: Yes Depression Symptoms: Isolating, Guilt, Feeling worthless/self pity Substance abuse history and/or treatment for substance abuse?: Yes Suicide prevention information given to non-admitted patients: Not applicable  Risk to Others within the past 6 months Homicidal Ideation: No Does patient have any lifetime risk of violence toward others beyond the six months prior to admission? : No Thoughts of Harm to Others: No Current Homicidal Intent: No Current Homicidal Plan: No Access to Homicidal Means: No Identified Victim: None reported History of harm to others?: No Assessment of Violence: None Noted Violent Behavior Description: None reported Does patient have access to weapons?: No Criminal Charges Pending?: No Does patient have a court date: No Is patient on  probation?: No  Psychosis Hallucinations: None noted Delusions: None noted  Mental Status Report Appearance/Hygiene: Disheveled, In scrubs, In hospital gown Eye Contact: Fair Motor Activity: Freedom of movement, Unremarkable Speech: Logical/coherent, Unremarkable Level of Consciousness: Alert Mood: Depressed, Anxious, Sad, Pleasant Affect: Anxious, Depressed, Sad Anxiety Level: Minimal Thought Processes: Coherent, Relevant Judgement: Unimpaired Orientation: Person, Place, Time, Situation, Appropriate for developmental age Obsessive Compulsive Thoughts/Behaviors: None  Cognitive Functioning Concentration: Normal Memory: Recent Intact, Remote Intact IQ: Average Insight: Poor Impulse Control: Poor Appetite: Fair Weight Loss: 0 Weight Gain: 0 Sleep: No Change Total Hours of Sleep: 7 Vegetative Symptoms: None  ADLScreening [facility] Assessment Services) Patient's cognitive ability adequate to safely complete daily activities?: Yes Patient able to express need for assistance with ADLs?: Yes Independently performs ADLs?: Yes (appropriate for developmental age)  Prior Inpatient Therapy Prior Inpatient Therapy: Yes Prior Therapy Dates: 2000 Prior Therapy Facilty/Provider(s): [facility] Reason for Treatment: Substance use  Prior Outpatient Therapy Prior Outpatient Therapy: No Prior Therapy Dates: n/a Prior Therapy Facilty/Provider(s): n/a Reason for Treatment: n/a Does patient have an ACCT team?: No Does patient have Intensive In-House Services?  : No Does patient have [facility] services? : No Does patient have  P4CC services?: No  ADL Screening (condition at time of admission) Patient's cognitive ability adequate to safely complete daily activities?: Yes Patient able to express need for assistance with ADLs?: Yes Independently performs ADLs?: Yes (appropriate for developmental age)       Abuse/Neglect Assessment (Assessment to be complete while patient is  alone) Physical Abuse: Denies Verbal Abuse: Denies Sexual Abuse: Denies Exploitation of patient/patient's resources: Denies Self-Neglect: Denies Values / Beliefs Cultural Requests During Hospitalization: None Spiritual Requests During Hospitalization: None Consults Spiritual Care Consult Needed: No Social Work Consult Needed: No Regulatory affairs officer (For Healthcare) Does patient have an advance directive?: No Would patient like information on creating an advanced directive?: Yes Higher education careers adviser given    Additional Information 1:1 In Past 12 Months?: No CIRT Risk: No Elopement Risk: No Does patient have medical clearance?: Yes  Child/Adolescent Assessment Running Away Risk: Denies (Pt is an adult)  Disposition:  Disposition Initial Assessment Completed for this Encounter: Yes Disposition of Patient: Referred to (RTS) Patient referred to: RTS  On Site Evaluation by:   Reviewed with Physician:    Gunnar Fusi, MS, LCAS, [place], [place], CCSI 02/02/2015 10:07 AM

## 2015-02-02 NOTE — ED Notes (Signed)
Pt sleeping no distress noted. Will continue to monitor.

## 2015-02-02 NOTE — ED Notes (Signed)

## 2015-02-02 NOTE — ED Notes (Signed)
BEHAVIORAL HEALTH ROUNDING Patient sleeping: No. Patient alert and oriented: Pt alert however, not oriented.  Behavior appropriate: Yes.  ; If no, describe:  Nutrition and fluids offered: Yes  Toileting and hygiene offered: Yes  Sitter present: yes Law enforcement present: Yes ODS

## 2015-02-02 NOTE — ED Notes (Signed)
BEHAVIORAL HEALTH ROUNDING Patient sleeping: Yes.   Patient alert and oriented: yes Behavior appropriate: Yes.  ; If no, describe:  Nutrition and fluids offered: Yes  Toileting and hygiene offered: Yes  Sitter present: yes Law enforcement present: Yes  

## 2015-02-02 NOTE — ED Notes (Signed)
BEHAVIORAL HEALTH ROUNDING Patient sleeping: No. Patient alert and oriented: No Behavior appropriate: Yes.  ; If no, describe:  Nutrition and fluids offered: Yes  Toileting and hygiene offered: Yes  Sitter present: yes Law enforcement present: Yes ODS

## 2015-02-02 NOTE — ED Notes (Signed)
BEHAVIORAL HEALTH ROUNDING Patient sleeping: Yes.   Patient alert and oriented: yes Behavior appropriate: Yes.  ; If no, describe:  Nutrition and fluids offered: Pt sleeping  Toileting and hygiene offered: Pt sleeping Sitter present: yes Law enforcement present: Yes

## 2015-02-02 NOTE — ED Notes (Signed)
BEHAVIORAL HEALTH ROUNDING Patient sleeping: No. Patient alert and oriented: Pt alert however not oriented.  Behavior appropriate: Yes.  ; If no, describe:  Nutrition and fluids offered: Yes  Toileting and hygiene offered: Yes  Sitter present: yes Law enforcement present: Yes ODS

## 2015-02-02 NOTE — Progress Notes (Signed)
TTS unable to assess Mr. [name]. [name] appeared confused and agitated. He was walking around his room without pants, and circling his bed.  He would not engage the TTS or the Nurse Assistants.  He was non verbal and not following the directives of the nurse assistant.

## 2015-02-02 NOTE — ED Notes (Signed)
BEHAVIORAL HEALTH ROUNDING Patient sleeping: No. Patient alert and oriented: yes Behavior appropriate: Yes.  ; If no, describe: n/a Nutrition and fluids offered: Yes  Toileting and hygiene offered: Yes  Sitter present: yes Law enforcement present: Yes

## 2015-02-02 NOTE — ED Notes (Addendum)
BEHAVIORAL HEALTH ROUNDING Patient sleeping: Yes.   Patient alert and oriented: yes Behavior appropriate: Yes.  ; If no, describe:  Nutrition and fluids offered: Pt sleeping Toileting and hygiene offered: Pt sleeping  Sitter present: yes Law enforcement present: Yes

## 2015-02-02 NOTE — ED Notes (Signed)
Pt up and walking towards door becoming increasingly agitated. Pt states he is leaving and "looking for the door". Preoccupied by staff until MD arrival. MD made aware of pt trying to leave. MD states pt is to be IVC. Verbal order with read back received for 1mg  IM ativan.

## 2015-02-02 NOTE — ED Provider Notes (Signed)
-----------------------------------------   7:29 AM on 02/02/2015 -----------------------------------------   BP 103/53 mmHg  Pulse 57  Temp(Src) 97.8 F (36.6 C) (Oral)  Ht 5\' 8"  (1.727 m)  Wt 165 lb (74.844 kg)  BMI 25.09 kg/m2  SpO2 97%  The patient had no acute events since last update.  Calm and cooperative at this time.  EKG was obtained for hypokalemia noted on serum chemistries. Oral potassium repletion given. Will recheck potassium in a.m. Disposition is pending per Psychiatry/Behavioral Medicine team recommendations.   ED ECG REPORT   Date: 02/02/2015  EKG Time: 0200  Rate: 55  Rhythm: sinus bradycardia  Axis: Normal  Intervals:nonspecific intraventricular conduction delay  ST&T Change: Nonspecific   Paulette Blanch, MD 02/02/15 [phone]

## 2015-02-02 NOTE — ED Notes (Signed)
BEHAVIORAL HEALTH ROUNDING Patient sleeping: No Patient alert and oriented: yes Behavior appropriate: Yes.  ; If no, describe: n/a Nutrition and fluids offered: Yes  Toileting and hygiene offered: Yes  Sitter present: yes Law enforcement present: Yes
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
## 2015-02-02 NOTE — ED Notes (Signed)
BEHAVIORAL HEALTH ROUNDING Patient sleeping: No. Patient alert and oriented: no Behavior appropriate: No ; If no, describe: Pt attempting to get out of bed.  Nutrition and fluids offered: Yes  Toileting and hygiene offered: Yes  Sitter present: yes Law enforcement present: Yes ODS

## 2015-02-02 NOTE — ED Provider Notes (Signed)
-----------------------------------------   3:55 PM on 02/02/2015 -----------------------------------------  [name] is an agitated demented 74 year old male who was sent to the emergency department due to agitated and violent behavior at [facility]. See the prior H&P and other notes for further detail.  Approximate 10 minutes ago the patient was agitated standing up and attempting to leave. He was walking around with nothing but a diaper and a scrub top on. He does not appear to understand his situation or the context he is in. With his agitation and mild aggressive behavior towards [place] we certainly do not feel comfortable letting him leave the emergency department. He could be considered a danger to others as well as himself in his current state. Due to this we are filing for an involuntary commitment so we can continue appropriate care at this time.  We will treat him with Ativan 1 mg IM now.   Ahmed Prima, MD 02/03/15 [phone]

## 2015-02-02 NOTE — ED Notes (Signed)
BEHAVIORAL HEALTH ROUNDING Patient sleeping: Yes.   Patient alert and oriented: yes Behavior appropriate: Yes.  ; If no, describe:  Nutrition and fluids offered: Pt sleeping Toileting and hygiene offered: Pt sleeping  Sitter present: yes Law enforcement present: Yes

## 2015-02-02 NOTE — ED Notes (Addendum)
BEHAVIORAL HEALTH ROUNDING Patient sleeping: Yes.   Patient alert and oriented: yes Behavior appropriate: Yes.  ; If no, describe:  Nutrition and fluids offered: Pt sleeping  Toileting and hygiene offered: Pt sleeping  Sitter present: yes Law enforcement present: Yes

## 2015-02-02 NOTE — ED Provider Notes (Signed)
-----------------------------------------   9:18 AM on 02/02/2015 -----------------------------------------  Potassium improved to 3.5 with repletion.  Joanne Gavel, MD 02/02/15 [phone]

## 2015-02-02 NOTE — ED Notes (Signed)
BEHAVIORAL HEALTH ROUNDING Patient sleeping: No. Patient alert and oriented: yes Behavior appropriate: Yes.  ; If no, describe:  Nutrition and fluids offered: Yes Toileting and hygiene offered: Yes  Sitter present: yes Law enforcement present: Yes ODS  

## 2015-02-03 DIAGNOSIS — G309 Alzheimer's disease, unspecified: Secondary | ICD-10-CM | POA: Diagnosis not present

## 2015-02-03 MED ORDER — POTASSIUM CHLORIDE 20 MEQ PO PACK
PACK | ORAL | Status: AC
  Administered 2015-02-03: 20 meq via ORAL
  Filled 2015-02-03: qty 1

## 2015-02-03 MED ORDER — QUETIAPINE FUMARATE 25 MG PO TABS
ORAL_TABLET | ORAL | Status: AC
  Administered 2015-02-03: 25 mg via ORAL
  Filled 2015-02-03: qty 1

## 2015-02-03 MED ORDER — ASPIRIN EC 81 MG PO TBEC
DELAYED_RELEASE_TABLET | ORAL | Status: AC
  Administered 2015-02-03: 81 mg via ORAL
  Filled 2015-02-03: qty 1

## 2015-02-03 MED ORDER — DIVALPROEX SODIUM 250 MG PO DR TAB
DELAYED_RELEASE_TABLET | ORAL | Status: AC
  Administered 2015-02-03: 250 mg via ORAL
  Filled 2015-02-03: qty 1

## 2015-02-03 MED ORDER — LISINOPRIL 20 MG PO TABS
ORAL_TABLET | ORAL | Status: AC
  Administered 2015-02-03: 10 mg via ORAL
  Filled 2015-02-03: qty 1

## 2015-02-03 MED ORDER — HYDROCHLOROTHIAZIDE 25 MG PO TABS
ORAL_TABLET | ORAL | Status: AC
  Administered 2015-02-03: 25 mg via ORAL
  Filled 2015-02-03: qty 1

## 2015-02-03 MED ORDER — QUETIAPINE FUMARATE 200 MG PO TABS
ORAL_TABLET | ORAL | Status: AC
  Administered 2015-02-03: 100 mg via ORAL
  Filled 2015-02-03: qty 1

## 2015-02-03 MED ORDER — ASPIRIN 81 MG PO CHEW
CHEWABLE_TABLET | ORAL | Status: AC
  Filled 2015-02-03: qty 1

## 2015-02-03 NOTE — ED Notes (Signed)
BEHAVIORAL HEALTH ROUNDING Patient sleeping: Yes.   Patient alert and oriented: no Behavior appropriate: Yes.  ; If no, describe:  Nutrition and fluids offered: Yes  Toileting and hygiene offered: Yes  Sitter present: no Law enforcement present: Yes  

## 2015-02-03 NOTE — ED Provider Notes (Signed)
-----------------------------------------   6:04 AM on 02/03/2015 -----------------------------------------   BP 106/57 mmHg  Pulse 52  Temp(Src) 98.1 F (36.7 C) (Oral)  Resp 20  Ht 5\' 8"  (1.727 m)  Wt 165 lb (74.844 kg)  BMI 25.09 kg/m2  SpO2 95%  The patient had no acute events since last update.  Calm and cooperative at this time.  Disposition is pending per Psychiatry/Behavioral Medicine team recommendations.     Paulette Blanch, MD 02/03/15 [phone]

## 2015-02-03 NOTE — ED Notes (Signed)
BEHAVIORAL HEALTH ROUNDING Patient sleeping: Yes.   Patient alert and oriented: yes Behavior appropriate: Yes.  ; If no, describe:  Nutrition and fluids offered: Yes  Toileting and hygiene offered: Yes  Sitter present: yes Law enforcement present: Yes  

## 2015-02-03 NOTE — Consult Note (Signed)
  Psychiatry: Follow-up for this 74 year old man with advanced dementia and behavior problems. Patient interviewed in the emergency room. He had no new complaints.  On review of systems he has no physical complaints today. Denies hallucinations.  On mental status exam disheveled man who is generally cooperative. Eye contact adequate. Psychomotor activity slow. Speech slow. Affect flat very demented. Not lashing out or aggressive. Vital signs normal.  Patient appears to be stable. Not actively agitated here. When his 4 transfer to geriatric psychiatry at the earliest opportunity or placement if possible. No change to treatment plan

## 2015-02-03 NOTE — ED Notes (Signed)
ED BHU [place] Is the patient under IVC or is there intent for IVC: Yes.    Is IVC current? Yes Is the patient medically cleared: Yes.   Is there vacancy in the ED BHU: Yes.   Is the population mix appropriate for patient: No.   Is the patient awaiting placement in inpatient or outpatient setting: Yes.   Has the patient had a psychiatric consult: Yes.   Survey of unit performed for contraband, proper placement and condition of furniture, tampering with fixtures in bathroom, shower, and each patient room: Yes.  ; Findings: none APPEARANCE/BEHAVIOR Cooperative,calm NEURO ASSESSMENT Orientation: person Hallucinations: none noted at this time Speech: Normal Gait: normal RESPIRATORY ASSESSMENT Breathing Pattern-regular, no respiratory distress noted CARDIOVASCULAR ASSESSMENT Skin color appropriate for age and race GASTROINTESTINAL ASSESSMENT no GI distress noted EXTREMITIES Moves all extremities, no distress noted PLAN OF CARE Provide calm/safe environment. Vital signs assessed twice daily. ED BHU Assessment once each 12-hour shift. Collaborate with intake RN daily or as condition indicates. Assure the ED provider has rounded once each shift. Provide and encourage hygiene. Provide redirection as needed. Assess for escalating behavior; address immediately and inform ED provider.  Assess family dynamic and appropriateness for visitation as needed: Yes.  Educate the patient/family about BHU procedures/visitation: No, pt with alzheimer's unable to verbalize understanding

## 2015-02-03 NOTE — ED Notes (Signed)
BEHAVIORAL HEALTH ROUNDING Patient sleeping: Yes.   Patient alert and oriented: yes Behavior appropriate: Yes.  ; If no, describe:  Nutrition and fluids offered: Yes  Toileting and hygiene offered: Yes  Sitter present: yes Law enforcement present: Yes ODS  

## 2015-02-03 NOTE — ED Notes (Signed)
BEHAVIORAL HEALTH ROUNDING Patient sleeping: Yes.   Patient alert and oriented: not applicable sleeping Behavior appropriate: Yes.  ; If no, describe:  Nutrition and fluids offered: No sleeping Toileting and hygiene offered: Yes  Sitter present: no Law enforcement present: Yes

## 2015-02-03 NOTE — ED Notes (Signed)

## 2015-02-03 NOTE — BHH Counselor (Signed)
Referral information for Geriatric Placement have been faxed to;  [facility] [phone]),  [facility] [phone]),  [facility](Don-[phone]),  [facility](Kathleen-[phone]),  [facility] [phone]),  [facility] [phone]),  [facility]([phone]).   No Beds and not taking referrals: [facility] ([phone])

## 2015-02-03 NOTE — ED Notes (Signed)
BEHAVIORAL HEALTH ROUNDING Patient sleeping: No. Patient alert and oriented: no Behavior appropriate: Yes.  ; If no, describe:  Nutrition and fluids offered: Yes  Toileting and hygiene offered: Yes  Sitter present: no Law enforcement present: Yes  

## 2015-02-04 DIAGNOSIS — G309 Alzheimer's disease, unspecified: Secondary | ICD-10-CM | POA: Diagnosis not present

## 2015-02-04 MED ORDER — LISINOPRIL 20 MG PO TABS
ORAL_TABLET | ORAL | Status: AC
  Filled 2015-02-04: qty 1

## 2015-02-04 MED ORDER — QUETIAPINE FUMARATE 25 MG PO TABS
ORAL_TABLET | ORAL | Status: AC
  Administered 2015-02-04: 25 mg via ORAL
  Filled 2015-02-04: qty 1

## 2015-02-04 MED ORDER — DIVALPROEX SODIUM 250 MG PO DR TAB
DELAYED_RELEASE_TABLET | ORAL | Status: AC
  Administered 2015-02-04: 250 mg via ORAL
  Filled 2015-02-04: qty 1

## 2015-02-04 MED ORDER — ASPIRIN 81 MG PO CHEW
CHEWABLE_TABLET | ORAL | Status: AC
  Administered 2015-02-04: 81 mg
  Filled 2015-02-04: qty 1

## 2015-02-04 MED ORDER — QUETIAPINE FUMARATE 200 MG PO TABS
ORAL_TABLET | ORAL | Status: AC
  Administered 2015-02-04: 100 mg via ORAL
  Filled 2015-02-04: qty 1

## 2015-02-04 MED ORDER — POTASSIUM CHLORIDE 20 MEQ PO PACK
PACK | ORAL | Status: AC
  Administered 2015-02-04: 20 meq via ORAL
  Filled 2015-02-04: qty 1

## 2015-02-04 NOTE — ED Notes (Signed)
BEHAVIORAL HEALTH ROUNDING Patient sleeping: Yes.   Patient alert and oriented: no Behavior appropriate: Yes.  ; If no, describe:  Nutrition and fluids offered: Yes  Toileting and hygiene offered: Yes  Sitter present: no Law enforcement present: Yes  

## 2015-02-04 NOTE — ED Notes (Signed)
BEHAVIORAL HEALTH ROUNDING Patient sleeping: Yes.   Patient alert and oriented: not applicable Behavior appropriate: Yes.    Nutrition and fluids offered: No Toileting and hygiene offered: No Sitter present: q15 minute observations Law enforcement present: Yes [employer] 

## 2015-02-04 NOTE — ED Notes (Signed)
BEHAVIORAL HEALTH ROUNDING Patient sleeping: No. Patient alert and oriented: no Behavior appropriate: Yes.  ; If no, describe:  Nutrition and fluids offered: Yes  Toileting and hygiene offered: Yes  Sitter present: no Law enforcement present: Yes  

## 2015-02-04 NOTE — Consult Note (Signed)
  Psychiatry: Follow-up for this elderly gentleman with dementia with behavioral disturbance. Patient has no new complaints. Patient has been passively cooperative. Vital signs remained stable. On review of systems he is generally negative offers no new complaints. On mental status this is a disheveled gentleman who is generally cooperative with nursing care. Extremely demented. Affect blank. Has not been aggressive or threatening here.  Patient is awaiting placement at a geriatric psychiatry unit or long-term facility for memory care. They are refusing them to take him back at his previous placement. No change to medication or treatment plan today

## 2015-02-04 NOTE — ED Notes (Signed)
ED BHU [place] Is the patient under IVC or is there intent for IVC: Yes.   Is the patient medically cleared: Yes.   Is there vacancy in the ED BHU: Yes.   Is the population mix appropriate for patient: No. Is the patient awaiting placement in inpatient or outpatient setting: Yes.   Has the patient had a psychiatric consult: Yes.   Survey of unit performed for contraband, proper placement and condition of furniture, tampering with fixtures in bathroom, shower, and each patient room: Yes.  ; Findings:  APPEARANCE/BEHAVIOR calm, cooperative and adequate rapport can be established NEURO ASSESSMENT Orientation: person Hallucinations: No.None noted (Hallucinations) Speech: Normal Gait: unsteady RESPIRATORY ASSESSMENT Normal expansion.  Clear to auscultation.  No rales, rhonchi, or wheezing. CARDIOVASCULAR ASSESSMENT regular rate and rhythm, S1, S2 normal, no murmur, click, rub or gallop GASTROINTESTINAL ASSESSMENT soft, nontender, BS WNL, no r/g EXTREMITIES normal strength, tone, and muscle mass PLAN OF CARE Provide calm/safe environment. Vital signs assessed twice daily. ED BHU Assessment once each 12-hour shift. Collaborate with intake RN daily or as condition indicates. Assure the ED provider has rounded once each shift. Provide and encourage hygiene. Provide redirection as needed. Assess for escalating behavior; address immediately and inform ED provider.  Assess family dynamic and appropriateness for visitation as needed: Yes.  ; If necessary, describe findings:  Educate the patient/family about BHU procedures/visitation: Yes.  ; If necessary, describe findings:

## 2015-02-04 NOTE — ED Notes (Signed)
PT  IVC  SEEN  BY  Dr  Weber Cooks  ON  02/04/15  PENDING  PLACEMENT  ALL IVC  PAPERWORK ON  CHART

## 2015-02-04 NOTE — ED Provider Notes (Signed)
-----------------------------------------   6:02 AM on 02/04/2015 -----------------------------------------   BP 96/56 mmHg  Pulse 63  Temp(Src) 97.9 F (36.6 C) (Oral)  Resp 20  Ht 5\' 8"  (1.727 m)  Wt 165 lb (74.844 kg)  BMI 25.09 kg/m2  SpO2 96%  The patient had no acute events since last update.  Calm and cooperative at this time.  Disposition is pending per Psychiatry/Behavioral Medicine team recommendations.     Paulette Blanch, MD 02/04/15 [phone]

## 2015-02-04 NOTE — Clinical Social Work Note (Addendum)
CSW f/u with the following facilities for placement:  [facility] [phone]) Denied due to [facility]) Currently at [facility] (Don [phone]) full [facility] [place] [place] [phone]) [facility] [phone]) Facility is currently full [facility] (P [phone]/ F [phone]) Facility closed [facility] (P [phone]/ F [phone]) Left a VM

## 2015-02-04 NOTE — ED Notes (Signed)
BEHAVIORAL HEALTH ROUNDING Patient sleeping: No. Patient alert and oriented: yes, oriented to person Behavior appropriate: Yes.   Nutrition and fluids offered: Yes  Toileting and hygiene offered: Yes  Sitter present: q15 min observations Law enforcement present: Yes [employer]

## 2015-02-05 DIAGNOSIS — G309 Alzheimer's disease, unspecified: Secondary | ICD-10-CM | POA: Diagnosis not present

## 2015-02-05 MED ORDER — QUETIAPINE FUMARATE 200 MG PO TABS
ORAL_TABLET | ORAL | Status: AC
  Administered 2015-02-05: 100 mg via ORAL
  Filled 2015-02-05: qty 1

## 2015-02-05 MED ORDER — QUETIAPINE FUMARATE 25 MG PO TABS
ORAL_TABLET | ORAL | Status: AC
  Filled 2015-02-05: qty 1

## 2015-02-05 MED ORDER — HYDROCHLOROTHIAZIDE 25 MG PO TABS
ORAL_TABLET | ORAL | Status: AC
  Administered 2015-02-05: 25 mg
  Filled 2015-02-05: qty 1

## 2015-02-05 MED ORDER — ASPIRIN EC 81 MG PO TBEC
DELAYED_RELEASE_TABLET | ORAL | Status: AC
  Filled 2015-02-05: qty 1

## 2015-02-05 MED ORDER — LISINOPRIL 20 MG PO TABS
ORAL_TABLET | ORAL | Status: AC
  Filled 2015-02-05: qty 1

## 2015-02-05 MED ORDER — POTASSIUM CHLORIDE 20 MEQ PO PACK
PACK | ORAL | Status: AC
  Administered 2015-02-05: 20 meq via ORAL
  Filled 2015-02-05: qty 1

## 2015-02-05 MED ORDER — DIVALPROEX SODIUM 250 MG PO DR TAB
DELAYED_RELEASE_TABLET | ORAL | Status: AC
  Administered 2015-02-05: 250 mg via ORAL
  Filled 2015-02-05: qty 1

## 2015-02-05 NOTE — ED Notes (Signed)
Patient soiled bed and clothing. Patient bedding changed and patient given new dry clothes.

## 2015-02-05 NOTE — ED Notes (Signed)

## 2015-02-05 NOTE — ED Notes (Signed)
BEHAVIORAL HEALTH ROUNDING Patient sleeping: Yes.   Patient alert and oriented: yes Behavior appropriate: Yes.  ; If no, describe:  Nutrition and fluids offered: Yes  Toileting and hygiene offered: Yes  Sitter present: no Law enforcement present: Yes  

## 2015-02-05 NOTE — ED Notes (Signed)
BEHAVIORAL HEALTH ROUNDING Patient sleeping: No. Patient alert and oriented: no pt is alert but not oriented. Pt has hx of dementia.  Behavior appropriate: Yes.  ; If no, describe:  Nutrition and fluids offered: Yes  Toileting and hygiene offered: Yes  Sitter present: not applicable Law enforcement present: Yes ODS

## 2015-02-05 NOTE — Clinical Social Work Note (Signed)
CSW attempted to contact [facility] about placement again but was only able to leave another VM.

## 2015-02-05 NOTE — ED Notes (Signed)
BEHAVIORAL HEALTH ROUNDING Patient sleeping: No. Patient alert and oriented: No Behavior appropriate: Yes.  ; If no, describe:  Nutrition and fluids offered: Yes  Toileting and hygiene offered: Yes  Sitter present: no Law enforcement present: Yes

## 2015-02-05 NOTE — ED Notes (Signed)
BEHAVIORAL HEALTH ROUNDING Patient sleeping: No. Patient alert and oriented: yes Behavior appropriate: Yes.  ; If no, describe:  Nutrition and fluids offered: Yes  Toileting and hygiene offered: Yes  Sitter present: no Law enforcement present: Yes  

## 2015-02-05 NOTE — ED Notes (Signed)
BEHAVIORAL HEALTH ROUNDING Patient sleeping: Yes.   Patient alert and oriented: not applicable Behavior appropriate: Yes.  ; If no, describe: SLEEPING Nutrition and fluids offered: No Toileting and hygiene offered: No Sitter present: not applicable Law enforcement present: Yes ODS

## 2015-02-05 NOTE — ED Provider Notes (Signed)
-----------------------------------------   12:25 AM on 02/05/2015 -----------------------------------------   BP 96/56 mmHg  Pulse 63  Temp(Src) 97.5 F (36.4 C) (Oral)  Resp 18  Ht 5\' 8"  (1.727 m)  Wt 165 lb (74.844 kg)  BMI 25.09 kg/m2  SpO2 98%  The patient had no acute events since last update.  Calm and cooperative at this time.  Disposition is pending per Psychiatry/Behavioral Medicine team recommendations. Pt doing well, will watch bp carefully   Nena Polio, MD 02/05/15 [phone]

## 2015-02-05 NOTE — ED Notes (Signed)
ED BHU [place] Is the patient under IVC or is there intent for IVC: Yes.   Is the patient medically cleared: Yes.   Is there vacancy in the ED BHU: Yes.   Is the population mix appropriate for patient: No. Is the patient awaiting placement in inpatient or outpatient setting: Yes.   GERI PSYCH PLACEMENT  Has the patient had a psychiatric consult: Yes.   Survey of unit performed for contraband, proper placement and condition of furniture, tampering with fixtures in bathroom, shower, and each patient room: Yes.  ; Findings: ALL CLEAR APPEARANCE/BEHAVIOR calm, cooperative and adequate rapport can be established NEURO ASSESSMENT Orientation: person PT HAS HX OF DEMENTIA.  Hallucinations: No.None noted (Hallucinations) Speech: Normal Gait: normal RESPIRATORY ASSESSMENT WNL CARDIOVASCULAR ASSESSMENT WNL GASTROINTESTINAL ASSESSMENT WNL EXTREMITIES ROM of all joints is normal PLAN OF CARE Provide calm/safe environment. Vital signs assessed twice daily. ED BHU Assessment once each 12-hour shift. Collaborate with intake RN daily or as condition indicates. Assure the ED provider has rounded once each shift. Provide and encourage hygiene. Provide redirection as needed. Assess for escalating behavior; address immediately and inform ED provider.  Assess family dynamic and appropriateness for visitation as needed: Yes.  ; If necessary, describe findings:  Educate the patient/family about BHU procedures/visitation: Yes.  ; If necessary, describe findings: PT CALM AND COOPERATIVE AT THIS TIME BUT REMAINS CONFUSED TO PERSON, PLACE OR TIME AS HIS NORMAL WITH HX OF DEMENTIA.

## 2015-02-05 NOTE — BHH Counselor (Signed)
Received phone call from [facility] ([phone]), pt. Is currently being reviewed by their medical staff. Nothing else needed at this time.

## 2015-02-05 NOTE — ED Notes (Signed)
BEHAVIORAL HEALTH ROUNDING Patient sleeping: Yes.   Patient alert and oriented: not applicable Behavior appropriate: Yes.  ; If no, describe: sleeping Nutrition and fluids offered: No Toileting and hygiene offered: No Sitter present: not applicable Law enforcement present: Yes ods

## 2015-02-05 NOTE — ED Notes (Signed)
Patient soiled clothing and bedding. Patient cleaned, given new clothes. Bed cleaned and new sheets applied.

## 2015-02-05 NOTE — Clinical Social Work Note (Addendum)
CSW sent out a [facility] referral.  [place] tracking # [id number]

## 2015-02-06 DIAGNOSIS — G309 Alzheimer's disease, unspecified: Secondary | ICD-10-CM | POA: Diagnosis not present

## 2015-02-06 MED ORDER — QUETIAPINE FUMARATE 25 MG PO TABS
ORAL_TABLET | ORAL | Status: AC
  Administered 2015-02-06: 100 mg via ORAL
  Filled 2015-02-06: qty 4

## 2015-02-06 MED ORDER — QUETIAPINE FUMARATE 25 MG PO TABS
ORAL_TABLET | ORAL | Status: AC
  Administered 2015-02-06: 100 mg via ORAL
  Filled 2015-02-06: qty 1

## 2015-02-06 MED ORDER — ASPIRIN EC 81 MG PO TBEC
DELAYED_RELEASE_TABLET | ORAL | Status: AC
  Administered 2015-02-06: 81 mg via ORAL
  Filled 2015-02-06: qty 1

## 2015-02-06 MED ORDER — QUETIAPINE FUMARATE 25 MG PO TABS
ORAL_TABLET | ORAL | Status: AC
  Administered 2015-02-06: 25 mg via ORAL
  Filled 2015-02-06: qty 1

## 2015-02-06 MED ORDER — HYDROCHLOROTHIAZIDE 25 MG PO TABS
ORAL_TABLET | ORAL | Status: AC
Start: 2015-02-06 — End: 2015-02-06
  Administered 2015-02-06: 25 mg via ORAL
  Filled 2015-02-06: qty 1

## 2015-02-06 MED ORDER — DIVALPROEX SODIUM 250 MG PO DR TAB
DELAYED_RELEASE_TABLET | ORAL | Status: AC
  Administered 2015-02-06: 250 mg via ORAL
  Filled 2015-02-06: qty 1

## 2015-02-06 MED ORDER — LISINOPRIL 20 MG PO TABS
ORAL_TABLET | ORAL | Status: AC
  Administered 2015-02-06: 10 mg via ORAL
  Filled 2015-02-06: qty 1

## 2015-02-06 MED ORDER — POTASSIUM CHLORIDE 20 MEQ PO PACK
PACK | ORAL | Status: AC
  Administered 2015-02-06: 20 meq via ORAL
  Filled 2015-02-06: qty 1

## 2015-02-06 NOTE — ED Notes (Signed)
BEHAVIORAL HEALTH ROUNDING Patient sleeping: Yes.   Patient alert and oriented: not applicable Behavior appropriate: Yes.  ; If no, describe:   Nutrition and fluids offered: No Toileting and hygiene offered: No Sitter present: no Law enforcement present: Yes  and ODS    

## 2015-02-06 NOTE — ED Notes (Signed)

## 2015-02-06 NOTE — ED Notes (Signed)
BEHAVIORAL HEALTH ROUNDING Patient sleeping: No. Patient alert and oriented: yes Behavior appropriate: Yes.  ; If no, describe:  Nutrition and fluids offered: Yes  Toileting and hygiene offered: Yes  Sitter present: yes Law enforcement present: Yes  

## 2015-02-06 NOTE — ED Notes (Signed)
BEHAVIORAL HEALTH ROUNDING Patient sleeping: No. Patient alert and oriented: no Behavior appropriate: No.; If no, describe: dementia Nutrition and fluids offered: Yes  Toileting and hygiene offered: Yes  Sitter present: no Law enforcement present: Yes  and ODS

## 2015-02-06 NOTE — ED Notes (Signed)
BEHAVIORAL HEALTH ROUNDING Patient sleeping: No. Patient alert and oriented: yes Behavior appropriate: Yes.  ; If no, describe:  Nutrition and fluids offered: Yes  Toileting and hygiene offered: No Sitter present: yes Law enforcement present: Yes  

## 2015-02-06 NOTE — ED Notes (Signed)
BEHAVIORAL HEALTH ROUNDING Patient sleeping: No. Patient alert and oriented: yes Behavior appropriate: Yes.  ; If no, describe:  Nutrition and fluids offered: Yes  Toileting and hygiene offered: No Sitter present: yes Law enforcement present: yes

## 2015-02-06 NOTE — ED Notes (Signed)
BEHAVIORAL HEALTH ROUNDING Patient sleeping: No. Patient alert and oriented: no Behavior appropriate: No.; If no, describe: demented Nutrition and fluids offered: Yes  Toileting and hygiene offered: Yes  Sitter present: no Law enforcement present: Yes  and ODS  ENVIRONMENTAL ASSESSMENT Potentially harmful objects out of patient reach: Yes.   Personal belongings secured: Yes.   Patient dressed in hospital provided attire only: Yes.   Plastic bags out of patient reach: Yes.   Patient care equipment (cords, cables, call bells, lines, and drains) shortened, removed, or accounted for: Yes.   Equipment and supplies removed from bottom of stretcher: Yes.   Potentially toxic materials out of patient reach: Yes.   Sharps container removed or out of patient reach: Yes.

## 2015-02-06 NOTE — ED Notes (Signed)
BEHAVIORAL HEALTH ROUNDING Patient sleeping: Yes.   Patient alert and oriented: yes Behavior appropriate: Yes.  ; If no, describe:  Nutrition and fluids offered: Yes  Toileting and hygiene offered: Yes  Sitter present: no Law enforcement present: Yes  

## 2015-02-06 NOTE — ED Notes (Signed)
BEHAVIORAL HEALTH ROUNDING Patient sleeping: No. Patient alert and oriented: yes Behavior appropriate: Yes.  ; If no, describe:  Nutrition and fluids offered: Yes  Toileting and hygiene offered: Yes  Sitter present: no Law enforcement present: Yes  

## 2015-02-06 NOTE — Progress Notes (Signed)
Confirmed with [facility] Staff Pamala Hurry Davis-[phone]), pt. Remain on [place]. Latanya Presser, MSW Clinical Social Work Department Emergency Room [phone] 5:06 PM

## 2015-02-06 NOTE — ED Notes (Signed)
BEHAVIORAL HEALTH ROUNDING Patient sleeping: Yes.   Patient alert and oriented: not applicable Behavior appropriate: Yes.  ; If no, describe:   Nutrition and fluids offered: No Toileting and hygiene offered: No Sitter present: not applicable Law enforcement present: Yes  and ODS

## 2015-02-06 NOTE — ED Notes (Signed)
Patient assigned to appropriate care area. Patient oriented to unit/care area: Informed that, for their safety, care areas are designed for safety and monitored by security cameras at all times; and visiting hours explained to patient. Patient verbalizes understanding, and verbal contract for safety obtained. 

## 2015-02-06 NOTE — ED Provider Notes (Signed)
-----------------------------------------   7:15 AM on 02/06/2015 -----------------------------------------   BP 113/73 mmHg  Pulse 72  Temp(Src) 98.1 F (36.7 C) (Oral)  Resp 18  Ht 5\' 8"  (1.727 m)  Wt 165 lb (74.844 kg)  BMI 25.09 kg/m2  SpO2 97%  The patient had no acute events since last update.  Calm and cooperative at this time.  Patient awaiting placement through the behavioral team     Loney Hering, MD 02/06/15 [phone]

## 2015-02-07 DIAGNOSIS — G309 Alzheimer's disease, unspecified: Secondary | ICD-10-CM | POA: Diagnosis not present

## 2015-02-07 MED ORDER — DIVALPROEX SODIUM 250 MG PO DR TAB
DELAYED_RELEASE_TABLET | ORAL | Status: AC
  Administered 2015-02-07: 250 mg via ORAL
  Filled 2015-02-07: qty 1

## 2015-02-07 MED ORDER — QUETIAPINE FUMARATE 25 MG PO TABS
ORAL_TABLET | ORAL | Status: AC
  Administered 2015-02-07: 25 mg via ORAL
  Filled 2015-02-07: qty 1

## 2015-02-07 MED ORDER — LISINOPRIL 20 MG PO TABS
ORAL_TABLET | ORAL | Status: AC
  Administered 2015-02-07: 10 mg via ORAL
  Filled 2015-02-07: qty 1

## 2015-02-07 MED ORDER — ASPIRIN EC 81 MG PO TBEC
DELAYED_RELEASE_TABLET | ORAL | Status: AC
  Administered 2015-02-07: 81 mg via ORAL
  Filled 2015-02-07: qty 1

## 2015-02-07 MED ORDER — HYDROCHLOROTHIAZIDE 25 MG PO TABS
ORAL_TABLET | ORAL | Status: AC
  Administered 2015-02-07: 25 mg via ORAL
  Filled 2015-02-07: qty 1

## 2015-02-07 NOTE — ED Notes (Signed)
BEHAVIORAL HEALTH ROUNDING Patient sleeping: No. Patient alert and oriented: yes Behavior appropriate: Yes.  ; If no, describe:  Nutrition and fluids offered: Yes  Toileting and hygiene offered: No Sitter present: yes Law enforcement present: Yes  

## 2015-02-07 NOTE — Discharge Summary (Signed)
Client has been accepted by Dr. Lorrene Reid @ [facility] to Rm: [phone]; and to call report to--[phone]; per Darlene @ 14:30.

## 2015-02-07 NOTE — ED Notes (Signed)
[name] (sister-in-law): [phone] Came to the hospital today for an update.  Concerned that [facility] states they will not allow patient back.  Also concerned because Harrie Foreman the POA is going out of town on Friday.  She is hoping patient can be placed near-by so family can continue to visit often.

## 2015-02-07 NOTE — ED Notes (Signed)

## 2015-02-07 NOTE — ED Provider Notes (Signed)
-----------------------------------------   2:39 PM on 02/07/2015 -----------------------------------------   BP 109/88 mmHg  Pulse 76  Temp(Src) 97.8 F (36.6 C) (Oral)  Resp 16  Ht 5\' 8"  (1.727 m)  Wt 165 lb (74.844 kg)  BMI 25.09 kg/m2  SpO2 100%  No acute events overnight. Vitals reviewed. Patient remains medically cleared.  Disposition is pending per Psychiatry/Behavioral Medicine team recommendations.   Lavonia Drafts, MD 02/07/15 [phone]

## 2015-02-07 NOTE — ED Notes (Signed)
BEHAVIORAL HEALTH ROUNDING Patient sleeping: No. Patient alert and oriented: yes Behavior appropriate: Yes.  ; If no, describe: n/a Nutrition and fluids offered: Yes  Toileting and hygiene offered: Yes  Sitter present: yes Law enforcement present: No

## 2015-02-07 NOTE — ED Notes (Signed)
ENVIRONMENTAL ASSESSMENT Potentially harmful objects out of patient reach: Yes.   Personal belongings secured: Yes.   Patient dressed in hospital provided attire only: No. Plastic bags out of patient reach: Yes.   Patient care equipment (cords, cables, call bells, lines, and drains) shortened, removed, or accounted for: Yes.   Equipment and supplies removed from bottom of stretcher: Yes.   Potentially toxic materials out of patient reach: Yes.   Sharps container removed or out of patient reach: Yes.   

## 2015-02-07 NOTE — ED Notes (Signed)
BEHAVIORAL HEALTH ROUNDING Patient sleeping: No. Patient alert and oriented: yes Behavior appropriate: Yes.  ; If no, describe: n/a Nutrition and fluids offered: Yes  Toileting and hygiene offered: Yes  Sitter present: not applicable Law enforcement present: Yes

## 2015-02-07 NOTE — ED Notes (Signed)
BEHAVIORAL HEALTH ROUNDING  Patient sleeping: Yes.  Patient alert and oriented: no  Behavior appropriate: Yes. ; If no, describe:  Nutrition and fluids offered: No  Toileting and hygiene offered: No  Sitter present: no  Law enforcement present: Yes   

## 2015-02-07 NOTE — ED Provider Notes (Signed)
-----------------------------------------   3:08 PM on 02/07/2015 -----------------------------------------   BP 109/88 mmHg  Pulse 76  Temp(Src) 97.8 F (36.6 C) (Oral)  Resp 16  Ht 5\' 8"  (1.727 m)  Wt 165 lb (74.844 kg)  BMI 25.09 kg/m2  SpO2 100%  Patient has been accepted to [facility].  Awaiting transfer.  Pt stable.  ----------------------------------------- 4:31 PM on 02/07/2015 -----------------------------------------  BP 107/57 mmHg  Pulse 55  Temp(Src) 99.2 F (37.3 C) (Oral)  Resp 16  Ht 5\' 8"  (1.727 m)  Wt 165 lb (74.844 kg)  BMI 25.09 kg/m2  SpO2 98%  Patient stable for transport.  No acute issues at this time.  Is going with law enforcement to his facility.  Hinda Kehr, MD 02/07/15 [phone]

## 2015-02-07 NOTE — ED Notes (Signed)
Pt. Upset tried to enter another pt. Room.  Pt. States he wants to go home.

## 2015-02-07 NOTE — Consult Note (Signed)
  Psychiatry: Follow-up for this 74 year old man with dementia. He has shown no behavior problems recently. On interview today the patient was awake and able to eat lunch by himself. Made good eye contact. Speech remains quiet and slow. Affect flat. He is confused and does not know where he is but is not hostile or aggressive. Able to cooperate with nursing to care for his ADLs. Vital signs stable. No new physical complaints.  Patient is stable with Alzheimer's dementia. Coronary artery disease high blood pressure. No indication for acute change to medicine. He has been accepted to an outpatient facility I am told and we are hoping for transfer later today.

## 2015-02-07 NOTE — ED Notes (Signed)
BEHAVIORAL HEALTH ROUNDING Patient sleeping: Yes.   Patient alert and oriented: not applicable Behavior appropriate: Yes.  ; If no, describe: sleeping Nutrition and fluids offered: Yes  Toileting and hygiene offered: Yes  Sitter present: q 15 min. checks Law enforcement present: Yes

## 2016-04-17 DEATH — deceased

## 2019-10-09 DIAGNOSIS — D123 Benign neoplasm of transverse colon: Secondary | ICD-10-CM
